# Patient Record
Sex: Female | Born: 1995
Health system: Southern US, Community
[De-identification: ages and names within clinical notes are randomized; demographics above are authoritative.]

## PROBLEM LIST (undated history)

## (undated) DIAGNOSIS — G43909 Migraine, unspecified, not intractable, without status migrainosus: Secondary | ICD-10-CM

## (undated) DIAGNOSIS — J189 Pneumonia, unspecified organism: Secondary | ICD-10-CM

## (undated) DIAGNOSIS — J45909 Unspecified asthma, uncomplicated: Secondary | ICD-10-CM

## (undated) DIAGNOSIS — R0600 Dyspnea, unspecified: Secondary | ICD-10-CM

## (undated) DIAGNOSIS — T4145XA Adverse effect of unspecified anesthetic, initial encounter: Secondary | ICD-10-CM

## (undated) DIAGNOSIS — J13 Pneumonia due to Streptococcus pneumoniae: Secondary | ICD-10-CM

## (undated) DIAGNOSIS — K900 Celiac disease: Secondary | ICD-10-CM

## (undated) DIAGNOSIS — T8859XA Other complications of anesthesia, initial encounter: Secondary | ICD-10-CM

## (undated) HISTORY — DX: Pneumonia due to Streptococcus pneumoniae: J13

## (undated) HISTORY — DX: Migraine, unspecified, not intractable, without status migrainosus: G43.909

## (undated) HISTORY — DX: Pneumonia, unspecified organism: J18.9

## (undated) HISTORY — PX: CHOLECYSTECTOMY: SHX55

## (undated) HISTORY — DX: Celiac disease: K90.0

---
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
## 2014-05-12 ENCOUNTER — Ambulatory Visit
Admission: RE | Admit: 2014-05-12 | Discharge: 2014-05-12 | Disposition: A | Discharge status: Home / Self Care | Payer: 59 | Source: Ambulatory Visit | Attending: Family Medicine | Admitting: Family Medicine

## 2014-05-12 ENCOUNTER — Other Ambulatory Visit: Payer: Self-pay | Admitting: Family Medicine

## 2014-05-12 DIAGNOSIS — M79672 Pain in left foot: Secondary | ICD-10-CM

## 2014-05-12 DIAGNOSIS — R609 Edema, unspecified: Secondary | ICD-10-CM

## 2017-01-15 ENCOUNTER — Encounter (HOSPITAL_COMMUNITY): Payer: Self-pay | Admitting: Emergency Medicine

## 2017-01-15 ENCOUNTER — Ambulatory Visit (HOSPITAL_COMMUNITY)
Admission: EM | Admit: 2017-01-15 | Discharge: 2017-01-15 | Disposition: A | Discharge status: Home / Self Care | Payer: 59 | Attending: Family Medicine | Admitting: Family Medicine

## 2017-01-15 DIAGNOSIS — R300 Dysuria: Secondary | ICD-10-CM

## 2017-01-15 DIAGNOSIS — Z9049 Acquired absence of other specified parts of digestive tract: Secondary | ICD-10-CM | POA: Diagnosis not present

## 2017-01-15 DIAGNOSIS — R102 Pelvic and perineal pain: Secondary | ICD-10-CM | POA: Diagnosis not present

## 2017-01-15 DIAGNOSIS — Z3202 Encounter for pregnancy test, result negative: Secondary | ICD-10-CM | POA: Diagnosis not present

## 2017-01-15 DIAGNOSIS — R109 Unspecified abdominal pain: Secondary | ICD-10-CM | POA: Diagnosis present

## 2017-01-15 DIAGNOSIS — N3000 Acute cystitis without hematuria: Secondary | ICD-10-CM

## 2017-01-15 DIAGNOSIS — R1024 Suprapubic pain: Secondary | ICD-10-CM

## 2017-01-15 DIAGNOSIS — Z880 Allergy status to penicillin: Secondary | ICD-10-CM | POA: Insufficient documentation

## 2017-01-15 LAB — POCT PREGNANCY, URINE: PREG TEST UR: NEGATIVE

## 2017-01-15 LAB — POCT URINALYSIS DIP (DEVICE)
BILIRUBIN URINE: NEGATIVE
Glucose, UA: NEGATIVE mg/dL
HGB URINE DIPSTICK: NEGATIVE
Ketones, ur: NEGATIVE mg/dL
NITRITE: POSITIVE — AB
PH: 7 (ref 5.0–8.0)
Protein, ur: NEGATIVE mg/dL
Specific Gravity, Urine: 1.01 (ref 1.005–1.030)
UROBILINOGEN UA: 0.2 mg/dL (ref 0.0–1.0)

## 2017-01-15 MED ORDER — NITROFURANTOIN MONOHYD MACRO 100 MG PO CAPS: 100.0000 mg | ORAL_CAPSULE | Freq: Two times a day (BID) | ORAL | 0 refills | Status: AC

## 2017-01-15 MED ORDER — PHENAZOPYRIDINE HCL 100 MG PO TABS: 100.0000 mg | ORAL_TABLET | Freq: Three times a day (TID) | ORAL | 0 refills | Status: AC | PRN

## 2017-01-15 MED ORDER — PHENAZOPYRIDINE HCL 100 MG PO TABS: 100.0000 mg | ORAL_TABLET | Freq: Three times a day (TID) | ORAL | 0 refills | Status: DC | PRN

## 2017-01-15 NOTE — ED Provider Notes (Signed)
[facility]    CSN: [id number] Arrival date & time: 01/15/17  1538     History   Chief Complaint Chief Complaint  Patient presents with  . Abdominal Cramping  . Dysuria    HPI Susan Elliott is a 21 y.o. female.   The history is provided by the patient. No language interpreter was used.  Abdominal Cramping  This is a new (C/O suprapubic cramping and dysuria) problem. Episode onset: few hours ago. The problem occurs constantly. The problem has been gradually worsening. Pertinent negatives include no chest pain, no abdominal pain, no headaches and no shortness of breath. Nothing aggravates the symptoms. Nothing relieves the symptoms. Treatments tried: She tried Azo at home without any improvement.  Dysuria  Pain quality:  Burning Pain severity:  Moderate Onset quality:  Gradual Duration: Few hours ago. Timing:  Constant Progression:  Worsening Chronicity:  New (She had kidney infection in the past and this was how it started) Recent urinary tract infections: no   Relieved by:  Nothing Worsened by:  Nothing Urinary symptoms: discolored urine, foul-smelling urine and frequent urination   Urinary symptoms: no hematuria   Urinary symptoms comment:  Denies vaginal discharge Associated symptoms: no abdominal pain   Risk factors: hx of pyelonephritis and sexually active   Risk factors: no sexually transmitted infections     History reviewed. No pertinent past medical history.  There are no active problems to display for this patient.   Past Surgical History:  Procedure Laterality Date  . CHOLECYSTECTOMY      OB History    No data available       Home Medications    Prior to Admission medications   Not on File    Family History History reviewed. No pertinent family history.  Social History Social History  Substance Use Topics  . Smoking status: Never Smoker  . Smokeless tobacco: Never Used  . Alcohol use Yes     Allergies    Penicillins   Review of Systems Review of Systems  Respiratory: Negative for shortness of breath.   Cardiovascular: Negative for chest pain.  Gastrointestinal: Negative for abdominal pain.  Genitourinary: Positive for dysuria.  Neurological: Negative for headaches.     Physical Exam Triage Vital Signs ED Triage Vitals  Enc Vitals Group     BP 01/15/17 1554 135/68     Pulse Rate 01/15/17 1554 89     Resp 01/15/17 1554 18     Temp 01/15/17 1554 98.3 F (36.8 C)     Temp Source 01/15/17 1554 Oral     SpO2 01/15/17 1554 99 %     Weight --      Height --      Head Circumference --      Peak Flow --      Pain Score 01/15/17 1552 6     Pain Loc --      Pain Edu? --      Excl. in [place]? --    No data found.   Updated Vital Signs BP 135/68 (BP Location: Right Arm)   Pulse 89   Temp 98.3 F (36.8 C) (Oral)   Resp 18   LMP 01/05/2017 (Exact Date)   SpO2 99%   Visual Acuity Right Eye Distance:   Left Eye Distance:   Bilateral Distance:    Right Eye Near:   Left Eye Near:    Bilateral Near:     Physical Exam  Constitutional: She appears well-developed.  Cardiovascular: Normal rate, regular rhythm and normal heart sounds.   No murmur heard. Pulmonary/Chest: Effort normal and breath sounds normal. No respiratory distress. She has no wheezes.  Abdominal: Soft. Bowel sounds are normal. She exhibits no distension and no mass. There is no tenderness. There is no rebound and no guarding.  Nursing note and vitals reviewed.    UC Treatments / Results  Labs (all labs ordered are listed, but only abnormal results are displayed) Labs Reviewed  POCT URINALYSIS DIP (DEVICE) - Abnormal; Notable for the following:       Result Value   Nitrite POSITIVE (*)    Leukocytes, UA SMALL (*)    All other components within normal limits  URINE CULTURE  POCT PREGNANCY, URINE    EKG  EKG Interpretation None       Radiology No results found.  Procedures Procedures  (including critical care time)  Medications Ordered in UC Medications - No data to display   Initial Impression / Assessment and Plan / UC Course  I have reviewed the triage vital signs and the nursing notes.  Pertinent labs & imaging results that were available during my care of the patient were reviewed by me and considered in my medical decision making (see chart for details).  Clinical Course as of Jan 15 1702  Nancy Fetter Jan 15, 2017  1702 Suprapubic pain and dysuria UA suspicious for UTI. Urine culture ordered. Urine pregnancy test neg. Will treat as non-complicated UTI with Nitrofurantoin while awaiting urine culture report. Pyridium prescribed prn dysuria. F/U as needed.   [KE]    Clinical Course User Index [KE] Kinnie Feil, MD    Urinalysis    Component Value Date/Time   LABSPEC 1.010 01/15/2017 1631   PHURINE 7.0 01/15/2017 1631   GLUCOSEU NEGATIVE 01/15/2017 1631   HGBUR NEGATIVE 01/15/2017 1631   BILIRUBINUR NEGATIVE 01/15/2017 1631   KETONESUR NEGATIVE 01/15/2017 1631   PROTEINUR NEGATIVE 01/15/2017 1631   UROBILINOGEN 0.2 01/15/2017 1631   NITRITE POSITIVE (A) 01/15/2017 1631   LEUKOCYTESUR SMALL (A) 01/15/2017 1631      Final Clinical Impressions(s) / UC Diagnoses   Final diagnoses:  None    New Prescriptions New Prescriptions   No medications on file     Kinnie Feil, MD 01/15/17 [phone]

## 2017-01-15 NOTE — Discharge Instructions (Signed)
°  UA suspicious for UTI. Urine culture ordered. Urine pregnancy test neg. Will treat as non-complicated UTI with Nitrofurantoin while awaiting urine culture report. Pyridium prescribed prn dysuria. F/U as needed. I have e-scribed your medication to the pharmacy of your choice.

## 2017-01-15 NOTE — ED Triage Notes (Signed)
The patient presented to the [facility] with a complaint of lower abdominal cramping, dysuria and urinary frequency that started today.

## 2017-01-18 LAB — URINE CULTURE: Culture: >=100000 — AB

## 2017-02-03 ENCOUNTER — Ambulatory Visit (INDEPENDENT_AMBULATORY_CARE_PROVIDER_SITE_OTHER): Payer: 59 | Admitting: Obstetrics & Gynecology

## 2017-02-03 ENCOUNTER — Encounter: Payer: Self-pay | Admitting: Obstetrics & Gynecology

## 2017-02-03 VITALS — BP 126/80 | Ht 67.25 in | Wt 198.0 lb

## 2017-02-03 DIAGNOSIS — L292 Pruritus vulvae: Secondary | ICD-10-CM | POA: Diagnosis not present

## 2017-02-03 LAB — WET PREP FOR TRICH, YEAST, CLUE
Clue Cells Wet Prep HPF POC: NONE SEEN
TRICH WET PREP: NONE SEEN

## 2017-02-03 MED ORDER — FLUCONAZOLE 150 MG PO TABS: 150.0000 mg | ORAL_TABLET | Freq: Every day | ORAL | 2 refills | Status: AC

## 2017-02-03 NOTE — Progress Notes (Signed)
    Susan Elliott 27-Feb-1996 [id number]        20 y.o.  G1P0010  Stable boyfriend.  On BCPs.    RP:  Established patient presenting because of vaginal/vulvar itching   HPI:  Had a Cystitis treated with Nitrofurantoin 01/15/2017.  Vaginal and vulvar itching started after the treatment.  Has frequent Yeast vaginitis and BV.  Using Lactobacilli vaginally once a week.  Not better when using condoms.  Well on BCPs otherwise.  Declines STI screen.  Past medical history,surgical history, problem list, medications, allergies, family history and social history were all reviewed and documented in the EPIC chart.  Directed ROS with pertinent positives and negatives documented in the history of present illness/assessment and plan.  Exam:  Vitals:   02/03/17 0955  BP: 126/80  Weight: 198 lb (89.8 kg)  Height: 5' 7.25" (1.708 m)   General appearance:  Normal  Gyn exam:  Vulva normal                     Speculum:  Vagina, cervix normal.  Mild increase in d/c, wet prep done.  Assessment/Plan:  21 y.o. G1P0010   1. Vulvar itching Confirmed Yeast Vaginitis/Vulvitis on Wet prep.  Fluconazole sent to pharmacy.  Recomnmendations discussed for treatment and prevention of recurrence.  Already using Lactobacilli vaginally.  F/U if decides to change from BCPs to Progestin IUD and for Annual/Gyn exam. - WET PREP FOR [place], YEAST, CLUE  Counseling on above issue >50% x 15 minutes.  Princess Bruins MD, 10:10 AM 02/03/2017

## 2017-02-03 NOTE — Patient Instructions (Signed)
1. Vulvar itching Confirmed Yeast Vaginitis/Vulvitis on Wet prep.  Fluconazole sent to pharmacy.  Recomnmendations discussed for treatment and prevention of recurrence.  Already using Lactobacilli vaginally.  F/U if decides to change from [facility] to Progestin IUD and for Annual/Gyn exam. - WET PREP FOR [place], YEAST, CLUE:  Yeasts present, otherwise normal.  Susan Elliott, it was a pleasure to see you today!  Enjoy your summer!

## 2017-03-07 ENCOUNTER — Telehealth: Payer: Self-pay | Admitting: *Deleted

## 2017-03-07 NOTE — Telephone Encounter (Signed)
Pt called c/o UTI asked if Rx could be sent to pharmacy, I explained to patient OV best for treatment for correct medication. Pt said said she unable to come in today, will try Urgent care later.

## 2017-03-08 ENCOUNTER — Ambulatory Visit (INDEPENDENT_AMBULATORY_CARE_PROVIDER_SITE_OTHER): Payer: 59 | Admitting: Obstetrics & Gynecology

## 2017-03-08 ENCOUNTER — Encounter: Payer: Self-pay | Admitting: Obstetrics & Gynecology

## 2017-03-08 VITALS — BP 126/82

## 2017-03-08 DIAGNOSIS — N3090 Cystitis, unspecified without hematuria: Secondary | ICD-10-CM | POA: Diagnosis not present

## 2017-03-08 DIAGNOSIS — R300 Dysuria: Secondary | ICD-10-CM

## 2017-03-08 LAB — URINALYSIS W MICROSCOPIC + REFLEX CULTURE
CRYSTALS: NONE SEEN [HPF]
Casts: NONE SEEN [LPF]
RBC / HPF: NONE SEEN RBC/HPF (ref <=2)
Yeast: NONE SEEN [HPF]

## 2017-03-08 MED ORDER — SULFAMETHOXAZOLE-TRIMETHOPRIM 800-160 MG PO TABS: 1.0000 | ORAL_TABLET | Freq: Two times a day (BID) | ORAL | 0 refills | Status: AC

## 2017-03-08 NOTE — Progress Notes (Signed)
    Susan Elliott 30-Jul-1996 [id number]        20 y.o.  G1P0010  RP:  Burning with miction  HPI:  Burning with miction with urinary frequency.  No back or flank pain.  No fever.  Normal vaginal secretion, no itching or odor.  Last Cystitis 01/15/2017 Rxed with MacroBID.  Past medical history,surgical history, problem list, medications, allergies, family history and social history were all reviewed and documented in the EPIC chart.  Directed ROS with pertinent positives and negatives documented in the history of present illness/assessment and plan.  Exam:  Vitals:   03/08/17 0838  BP: 126/82   General appearance:  Normal  CVAT neg bilaterally Mild suprapubic tenderness  Assessment/Plan:  21 y.o. G1P0010   1. Dysuria  Cystitis, no evidence of APN.  Nit pos, many bacteria.  Pending culture.  Bactrim prescription sent.  Will use Fluconazole PRN after ABTx.   - Urinalysis with Culture Reflex  2. Recurrent Cystitis Prevention discussed.  Declines prophylaxis at this time, will consider if more episodes.  Last Cystitis was 01/15/2017.   Counseling on above issues >50% x 15 minutes.  Princess Bruins MD, 8:56 AM 03/08/2017

## 2017-03-08 NOTE — Patient Instructions (Signed)
1. Dysuria  Cystitis, no evidence of APN.  Nit pos, many bacteria.  Pending culture.  Bactrim prescription sent.  Will use Fluconazole PRN after ABTx.   - Urinalysis with Culture Reflex  2. Recurrent Cystitis Prevention discussed.  Declines prophylaxis at this time, will consider if more episodes.  Last Cystitis was 01/15/2017.  Susan Elliott, good to see you today!  I will inform you of your U. Culture result as soon as available.   Urinary Tract Infection, Adult A urinary tract infection (UTI) is an infection of any part of the urinary tract, which includes the kidneys, ureters, bladder, and urethra. These organs make, store, and get rid of urine in the body. UTI can be a bladder infection (cystitis) or kidney infection (pyelonephritis). What are the causes? This infection may be caused by fungi, viruses, or bacteria. Bacteria are the most common cause of UTIs. This condition can also be caused by repeated incomplete emptying of the bladder during urination. What increases the risk? This condition is more likely to develop if:  You ignore your need to urinate or hold urine for long periods of time.  You do not empty your bladder completely during urination.  You wipe back to front after urinating or having a bowel movement, if you are female.  You are uncircumcised, if you are female.  You are constipated.  You have a urinary catheter that stays in place (indwelling).  You have a weak defense (immune) system.  You have a medical condition that affects your bowels, kidneys, or bladder.  You have diabetes.  You take antibiotic medicines frequently or for long periods of time, and the antibiotics no longer work well against certain types of infections (antibiotic resistance).  You take medicines that irritate your urinary tract.  You are exposed to chemicals that irritate your urinary tract.  You are female.  What are the signs or symptoms? Symptoms of this condition  include:  Fever.  Frequent urination or passing small amounts of urine frequently.  Needing to urinate urgently.  Pain or burning with urination.  Urine that smells bad or unusual.  Cloudy urine.  Pain in the lower abdomen or back.  Trouble urinating.  Blood in the urine.  Vomiting or being less hungry than normal.  Diarrhea or abdominal pain.  Vaginal discharge, if you are female.  How is this diagnosed? This condition is diagnosed with a medical history and physical exam. You will also need to provide a urine sample to test your urine. Other tests may be done, including:  Blood tests.  Sexually transmitted disease (STD) testing.  If you have had more than one UTI, a cystoscopy or imaging studies may be done to determine the cause of the infections. How is this treated? Treatment for this condition often includes a combination of two or more of the following:  Antibiotic medicine.  Other medicines to treat less common causes of UTI.  Over-the-counter medicines to treat pain.  Drinking enough water to stay hydrated.  Follow these instructions at home:  Take over-the-counter and prescription medicines only as told by your health care provider.  If you were prescribed an antibiotic, take it as told by your health care provider. Do not stop taking the antibiotic even if you start to feel better.  Avoid alcohol, caffeine, tea, and carbonated beverages. They can irritate your bladder.  Drink enough fluid to keep your urine clear or pale yellow.  Keep all follow-up visits as told by your health care provider. This  is important.  Make sure to: ? Empty your bladder often and completely. Do not hold urine for long periods of time. ? Empty your bladder before and after sex. ? Wipe from front to back after a bowel movement if you are female. Use each tissue one time when you wipe. Contact a health care provider if:  You have back pain.  You have a fever.  You  feel nauseous or vomit.  Your symptoms do not get better after 3 days.  Your symptoms go away and then return. Get help right away if:  You have severe back pain or lower abdominal pain.  You are vomiting and cannot keep down any medicines or water. This information is not intended to replace advice given to you by your health care provider. Make sure you discuss any questions you have with your health care provider. Document Released: 05/25/2005 Document Revised: 01/27/2016 Document Reviewed: 07/06/2015 Elsevier Interactive Patient Education  2017 [employer].

## 2017-03-09 ENCOUNTER — Ambulatory Visit: Payer: 59 | Admitting: Obstetrics & Gynecology

## 2017-03-09 LAB — URINE CULTURE

## 2017-03-12 ENCOUNTER — Encounter: Payer: Self-pay | Admitting: Obstetrics & Gynecology

## 2017-03-13 ENCOUNTER — Other Ambulatory Visit: Payer: Self-pay | Admitting: Women's Health

## 2017-03-13 ENCOUNTER — Telehealth: Payer: Self-pay | Admitting: *Deleted

## 2017-03-13 MED ORDER — METRONIDAZOLE 0.75 % VA GEL: VAGINAL | 0 refills | Status: DC

## 2017-03-13 NOTE — Telephone Encounter (Signed)
Patient said its impossible for her to get off work again.

## 2017-03-13 NOTE — Telephone Encounter (Signed)
(  Dr.Lavoie patient) this is patient my chart message:   I am having burning and itching. I already finished taking the sulfamethoxazole and also experienced itching so went ahead and got the prn fluconazole she gave me and that didn't help so it must be because a BV instead which I've experienced those way more than UTIs. I was prescribed Metronidazole vaginal cream a while ago when I used to get these a lot from [place] at [employer].. Could you guys prescribe me something for that? I've been in pain since Saturday evening. I thought the meds for the UTI that is why I didn't want to go to urgent care or anything.

## 2017-03-13 NOTE — Telephone Encounter (Signed)
Please call and review best to have OV to check if BV or Yeast.  Let me know if this is not possible

## 2017-03-13 NOTE — Telephone Encounter (Signed)
Telephone call, reviewed our policy is not to prescribe MetroGel over the phone states cannot come in. States Dr. Dellis Filbert has called in in the past. Reviewed we will call in this one time but we cannot do this on a regular basis. If no relief office visit is required. Agreeable with plan.

## 2017-03-13 NOTE — Telephone Encounter (Signed)
I did recommend OV for pt

## 2017-07-26 ENCOUNTER — Encounter: Payer: Self-pay | Admitting: Obstetrics & Gynecology

## 2017-07-26 ENCOUNTER — Ambulatory Visit (INDEPENDENT_AMBULATORY_CARE_PROVIDER_SITE_OTHER): Payer: 59 | Admitting: Obstetrics & Gynecology

## 2017-07-26 VITALS — BP 124/88 | Ht 67.0 in | Wt 203.0 lb

## 2017-07-26 DIAGNOSIS — Z113 Encounter for screening for infections with a predominantly sexual mode of transmission: Secondary | ICD-10-CM | POA: Diagnosis not present

## 2017-07-26 DIAGNOSIS — Z3041 Encounter for surveillance of contraceptive pills: Secondary | ICD-10-CM

## 2017-07-26 DIAGNOSIS — Z01419 Encounter for gynecological examination (general) (routine) without abnormal findings: Secondary | ICD-10-CM | POA: Diagnosis not present

## 2017-07-26 DIAGNOSIS — Z7189 Other specified counseling: Secondary | ICD-10-CM

## 2017-07-26 DIAGNOSIS — N898 Other specified noninflammatory disorders of vagina: Secondary | ICD-10-CM | POA: Diagnosis not present

## 2017-07-26 DIAGNOSIS — Z7185 Encounter for immunization safety counseling: Secondary | ICD-10-CM

## 2017-07-26 LAB — WET PREP FOR TRICH, YEAST, CLUE

## 2017-07-26 MED ORDER — NORETHINDRONE ACET-ETHINYL EST 1-20 MG-MCG PO TABS: 1.0000 | ORAL_TABLET | Freq: Every day | ORAL | 4 refills | Status: DC

## 2017-07-26 NOTE — Progress Notes (Signed)
Susan Elliott [birth year] [id number]   History:    21 y.o. G1P0A1 Stable boyfriend x 2 years  RP:  Established patient presenting for annual gyn exam   HPI:  Well on Junel 1/20.  Frequent BV infections.  No pelvic pain.  Breasts wnl now, but had more tenderness lately.  Currently having loose stools/Wheezing, seen at Urgent Care last evening.  Past medical history,surgical history, family history and social history were all reviewed and documented in the EPIC chart.  Gynecologic History Patient's last menstrual period was 07/14/2017. Contraception: OCP (estrogen/progesterone) Last Pap:  Previous pap normal per patient Last mammogram: Never  Obstetric History OB History  Gravida Para Term Preterm AB Living  1       1 0  SAB TAB Ectopic Multiple Live Births    1          # Outcome Date GA Lbr Len/2nd Weight Sex Delivery Anes PTL Lv  1 TAB                ROS: A ROS was performed and pertinent positives and negatives are included in the history.  GENERAL: No fevers or chills. HEENT: No change in vision, no earache, sore throat or sinus congestion. NECK: No pain or stiffness. CARDIOVASCULAR: No chest pain or pressure. No palpitations. PULMONARY: No shortness of breath, cough or wheeze. GASTROINTESTINAL: No abdominal pain, nausea, vomiting or diarrhea, melena or bright red blood per rectum. GENITOURINARY: No urinary frequency, urgency, hesitancy or dysuria. MUSCULOSKELETAL: No joint or muscle pain, no back pain, no recent trauma. DERMATOLOGIC: No rash, no itching, no lesions. ENDOCRINE: No polyuria, polydipsia, no heat or cold intolerance. No recent change in weight. HEMATOLOGICAL: No anemia or easy bruising or bleeding. NEUROLOGIC: No headache, seizures, numbness, tingling or weakness. PSYCHIATRIC: No depression, no loss of interest in normal activity or change in sleep pattern.     Exam:   BP 124/88   Ht 5\' 7"  (1.702 m)   Wt 203 lb (92.1 kg)   LMP 07/14/2017 Comment: PILL   BMI 31.79 kg/m   Body mass index is 31.79 kg/m.  General appearance : Well developed well nourished female. No acute distress HEENT: Eyes: no retinal hemorrhage or exudates,  Neck supple, trachea midline, no carotid bruits, no thyroidmegaly Lungs: Clear to auscultation, no rhonchi or wheezes, or rib retractions  Heart: Regular rate and rhythm, no murmurs or gallops Breast:Examined in sitting and supine position were symmetrical in appearance, no palpable masses or tenderness,  no skin retraction, no nipple inversion, no nipple discharge, no skin discoloration, no axillary or supraclavicular lymphadenopathy Abdomen: no palpable masses or tenderness, no rebound or guarding Extremities: no edema or skin discoloration or tenderness  Pelvic: Vulva normal  [place], Urethra, Skene Glands: Within normal limits             Vagina: No gross lesions or discharge  Cervix: No gross lesions or discharge.  Pap reflex/Gono-Chlam done.  Uterus  AV, normal size, shape and consistency, non-tender and mobile  Adnexa  Without masses or tenderness  Anus and perineum  normal    Assessment/Plan:  21 y.o. female for annual exam   1. Encounter for routine gynecological examination with Papanicolaou smear of cervix Normal gyn exam.  Pap reflex done.  Breasts wnl.  2. Encounter for surveillance of contraceptive pills Well on Junel 1/20.  No contraindication.  Junel 1/20 represcribed.  3. Vaginal discharge Yeast vaginitis.  Will use Fluconazole 1 tab per mouth x 1 (  Patient still has medication at home). - WET PREP FOR [place], YEAST, CLUE  4. Screen for STD (sexually transmitted disease) Condom use strongly recommended.  -Gono-Chlam done  5. HPV vaccine counseling Info and pamphlet given.  Patient declines first dose today.  Will call back when ready.  Counseling on above issues more than 50% for 10 minutes.  Princess Bruins MD, 12:02 PM 07/26/2017

## 2017-07-29 LAB — PAP IG, CT-NG, RFX HPV ASCU
C. trachomatis RNA, TMA: NOT DETECTED
N. gonorrhoeae RNA, TMA: NOT DETECTED

## 2017-07-29 NOTE — Patient Instructions (Signed)
1. Encounter for routine gynecological examination with Papanicolaou smear of cervix Normal gyn exam.  Pap reflex done.  Breasts wnl.  2. Encounter for surveillance of contraceptive pills Well on Junel 1/20.  No contraindication.  Junel 1/20 represcribed.  3. Vaginal discharge Yeast vaginitis.  Will use Fluconazole 1 tab per mouth x 1 (Patient still has medication at home). - WET PREP FOR [place], YEAST, CLUE  4. Screen for STD (sexually transmitted disease) Condom use strongly recommended.  -Gono-Chlam done  5. HPV vaccine counseling Info and pamphlet given.  Patient declines first dose today.  Will call back when ready.  Susan Elliott, it was a pleasure seeing you today!  I will inform you of your results as soon as available.  Health Maintenance, Female Adopting a healthy lifestyle and getting preventive care can go a long way to promote health and wellness. Talk with your health care provider about what schedule of regular examinations is right for you. This is a good chance for you to check in with your provider about disease prevention and staying healthy. In between checkups, there are plenty of things you can do on your own. Experts have done a lot of research about which lifestyle changes and preventive measures are most likely to keep you healthy. Ask your health care provider for more information. Weight and diet Eat a healthy diet  Be sure to include plenty of vegetables, fruits, low-fat dairy products, and lean protein.  Do not eat a lot of foods high in solid fats, added sugars, or salt.  Get regular exercise. This is one of the most important things you can do for your health. ? Most adults should exercise for at least 150 minutes each week. The exercise should increase your heart rate and make you sweat (moderate-intensity exercise). ? Most adults should also do strengthening exercises at least twice a week. This is in addition to the moderate-intensity exercise.  Maintain a  healthy weight  Body mass index (BMI) is a measurement that can be used to identify possible weight problems. It estimates body fat based on height and weight. Your health care provider can help determine your BMI and help you achieve or maintain a healthy weight.  For females 23 years of age and older: ? A BMI below 18.5 is considered underweight. ? A BMI of 18.5 to 24.9 is normal. ? A BMI of 25 to 29.9 is considered overweight. ? A BMI of 30 and above is considered obese.  Watch levels of cholesterol and blood lipids  You should start having your blood tested for lipids and cholesterol at 23 years of age, then have this test every 5 years.  You may need to have your cholesterol levels checked more often if: ? Your lipid or cholesterol levels are high. ? You are older than 23 years of age. ? You are at high risk for heart disease.  Cancer screening Lung Cancer  Lung cancer screening is recommended for adults 69-31 years old who are at high risk for lung cancer because of a history of smoking.  A yearly low-dose CT scan of the lungs is recommended for people who: ? Currently smoke. ? Have quit within the past 15 years. ? Have at least a 30-pack-year history of smoking. A pack year is smoking an average of one pack of cigarettes a day for 1 year.  Yearly screening should continue until it has been 15 years since you quit.  Yearly screening should stop if you develop a health  problem that would prevent you from having lung cancer treatment.  Breast Cancer  Practice breast self-awareness. This means understanding how your breasts normally appear and feel.  It also means doing regular breast self-exams. Let your health care provider know about any changes, no matter how small.  If you are in your 20s or 30s, you should have a clinical breast exam (CBE) by a health care provider every 1-3 years as part of a regular health exam.  If you are 64 or older, have a CBE every year. Also  consider having a breast X-ray (mammogram) every year.  If you have a family history of breast cancer, talk to your health care provider about genetic screening.  If you are at high risk for breast cancer, talk to your health care provider about having an MRI and a mammogram every year.  Breast cancer gene (BRCA) assessment is recommended for women who have family members with BRCA-related cancers. BRCA-related cancers include: ? Breast. ? Ovarian. ? Tubal. ? Peritoneal cancers.  Results of the assessment will determine the need for genetic counseling and BRCA1 and BRCA2 testing.  Cervical Cancer Your health care provider may recommend that you be screened regularly for cancer of the pelvic organs (ovaries, uterus, and vagina). This screening involves a pelvic examination, including checking for microscopic changes to the surface of your cervix (Pap test). You may be encouraged to have this screening done every 3 years, beginning at age 30.  For women ages 34-65, health care providers may recommend pelvic exams and Pap testing every 3 years, or they may recommend the Pap and pelvic exam, combined with testing for human papilloma virus (HPV), every 5 years. Some types of HPV increase your risk of cervical cancer. Testing for HPV may also be done on women of any age with unclear Pap test results.  Other health care providers may not recommend any screening for nonpregnant women who are considered low risk for pelvic cancer and who do not have symptoms. Ask your health care provider if a screening pelvic exam is right for you.  If you have had past treatment for cervical cancer or a condition that could lead to cancer, you need Pap tests and screening for cancer for at least 20 years after your treatment. If Pap tests have been discontinued, your risk factors (such as having a new sexual partner) need to be reassessed to determine if screening should resume. Some women have medical problems that  increase the chance of getting cervical cancer. In these cases, your health care provider may recommend more frequent screening and Pap tests.  Colorectal Cancer  This type of cancer can be detected and often prevented.  Routine colorectal cancer screening usually begins at 23 years of age and continues through 23 years of age.  Your health care provider may recommend screening at an earlier age if you have risk factors for colon cancer.  Your health care provider may also recommend using home test kits to check for hidden blood in the stool.  A small camera at the end of a tube can be used to examine your colon directly (sigmoidoscopy or colonoscopy). This is done to check for the earliest forms of colorectal cancer.  Routine screening usually begins at age 38.  Direct examination of the colon should be repeated every 5-10 years through 23 years of age. However, you may need to be screened more often if early forms of precancerous polyps or small growths are found.  Skin Cancer  Check your skin from head to toe regularly.  Tell your health care provider about any new moles or changes in moles, especially if there is a change in a mole's shape or color.  Also tell your health care provider if you have a mole that is larger than the size of a pencil eraser.  Always use sunscreen. Apply sunscreen liberally and repeatedly throughout the day.  Protect yourself by wearing long sleeves, pants, a wide-brimmed hat, and sunglasses whenever you are outside.  Heart disease, diabetes, and high blood pressure  High blood pressure causes heart disease and increases the risk of stroke. High blood pressure is more likely to develop in: ? People who have blood pressure in the high end of the normal range (130-139/85-89 mm Hg). ? People who are overweight or obese. ? People who are African American.  If you are 45-71 years of age, have your blood pressure checked every 3-5 years. If you are 81  years of age or older, have your blood pressure checked every year. You should have your blood pressure measured twice-once when you are at a hospital or clinic, and once when you are not at a hospital or clinic. Record the average of the two measurements. To check your blood pressure when you are not at a hospital or clinic, you can use: ? An automated blood pressure machine at a pharmacy. ? A home blood pressure monitor.  If you are between 19 years and 71 years old, ask your health care provider if you should take aspirin to prevent strokes.  Have regular diabetes screenings. This involves taking a blood sample to check your fasting blood sugar level. ? If you are at a normal weight and have a low risk for diabetes, have this test once every three years after 23 years of age. ? If you are overweight and have a high risk for diabetes, consider being tested at a younger age or more often. Preventing infection Hepatitis B  If you have a higher risk for hepatitis B, you should be screened for this virus. You are considered at high risk for hepatitis B if: ? You were born in a country where hepatitis B is common. Ask your health care provider which countries are considered high risk. ? Your parents were born in a high-risk country, and you have not been immunized against hepatitis B (hepatitis B vaccine). ? You have HIV or AIDS. ? You use needles to inject street drugs. ? You live with someone who has hepatitis B. ? You have had sex with someone who has hepatitis B. ? You get hemodialysis treatment. ? You take certain medicines for conditions, including cancer, organ transplantation, and autoimmune conditions.  Hepatitis C  Blood testing is recommended for: ? Everyone born from [birth year] through 1967. ? Anyone with known risk factors for hepatitis C.  Sexually transmitted infections (STIs)  You should be screened for sexually transmitted infections (STIs) including gonorrhea and chlamydia  if: ? You are sexually active and are younger than 23 years of age. ? You are older than 23 years of age and your health care provider tells you that you are at risk for this type of infection. ? Your sexual activity has changed since you were last screened and you are at an increased risk for chlamydia or gonorrhea. Ask your health care provider if you are at risk.  If you do not have HIV, but are at risk, it may be recommended that you take a prescription  medicine daily to prevent HIV infection. This is called pre-exposure prophylaxis (PrEP). You are considered at risk if: ? You are sexually active and do not regularly use condoms or know the HIV status of your partner(s). ? You take drugs by injection. ? You are sexually active with a partner who has HIV.  Talk with your health care provider about whether you are at high risk of being infected with HIV. If you choose to begin PrEP, you should first be tested for HIV. You should then be tested every 3 months for as long as you are taking PrEP. Pregnancy  If you are premenopausal and you may become pregnant, ask your health care provider about preconception counseling.  If you may become pregnant, take 400 to 800 micrograms (mcg) of folic acid every day.  If you want to prevent pregnancy, talk to your health care provider about birth control (contraception). Osteoporosis and menopause  Osteoporosis is a disease in which the bones lose minerals and strength with aging. This can result in serious bone fractures. Your risk for osteoporosis can be identified using a bone density scan.  If you are 29 years of age or older, or if you are at risk for osteoporosis and fractures, ask your health care provider if you should be screened.  Ask your health care provider whether you should take a calcium or vitamin D supplement to lower your risk for osteoporosis.  Menopause may have certain physical symptoms and risks.  Hormone replacement therapy  may reduce some of these symptoms and risks. Talk to your health care provider about whether hormone replacement therapy is right for you. Follow these instructions at home:  Schedule regular health, dental, and eye exams.  Stay current with your immunizations.  Do not use any tobacco products including cigarettes, chewing tobacco, or electronic cigarettes.  If you are pregnant, do not drink alcohol.  If you are breastfeeding, limit how much and how often you drink alcohol.  Limit alcohol intake to no more than 1 drink per day for nonpregnant women. One drink equals 12 ounces of beer, 5 ounces of wine, or 1 ounces of hard liquor.  Do not use street drugs.  Do not share needles.  Ask your health care provider for help if you need support or information about quitting drugs.  Tell your health care provider if you often feel depressed.  Tell your health care provider if you have ever been abused or do not feel safe at home. This information is not intended to replace advice given to you by your health care provider. Make sure you discuss any questions you have with your health care provider. Document Released: 02/28/2011 Document Revised: 01/21/2016 Document Reviewed: 05/19/2015 Elsevier Interactive Patient Education  [employer].

## 2017-08-07 ENCOUNTER — Encounter: Payer: Self-pay | Admitting: Obstetrics & Gynecology

## 2017-08-08 MED ORDER — NORETHIN ACE-ETH ESTRAD-FE 1-20 MG-MCG(24) PO CHEW: 1.0000 | CHEWABLE_TABLET | Freq: Every day | ORAL | 3 refills | Status: DC

## 2018-03-27 ENCOUNTER — Other Ambulatory Visit: Payer: Self-pay

## 2018-03-27 ENCOUNTER — Encounter (HOSPITAL_BASED_OUTPATIENT_CLINIC_OR_DEPARTMENT_OTHER): Payer: Self-pay | Admitting: Emergency Medicine

## 2018-03-27 ENCOUNTER — Emergency Department (HOSPITAL_BASED_OUTPATIENT_CLINIC_OR_DEPARTMENT_OTHER): Payer: 59

## 2018-03-27 ENCOUNTER — Emergency Department (HOSPITAL_BASED_OUTPATIENT_CLINIC_OR_DEPARTMENT_OTHER)
Admission: EM | Admit: 2018-03-27 | Discharge: 2018-03-27 | Disposition: A | Discharge status: Home / Self Care | Payer: 59 | Attending: Emergency Medicine | Admitting: Emergency Medicine

## 2018-03-27 DIAGNOSIS — J189 Pneumonia, unspecified organism: Secondary | ICD-10-CM | POA: Diagnosis not present

## 2018-03-27 DIAGNOSIS — G43009 Migraine without aura, not intractable, without status migrainosus: Secondary | ICD-10-CM

## 2018-03-27 DIAGNOSIS — G43909 Migraine, unspecified, not intractable, without status migrainosus: Secondary | ICD-10-CM | POA: Diagnosis present

## 2018-03-27 LAB — PREGNANCY, URINE: Preg Test, Ur: NEGATIVE

## 2018-03-27 MED ORDER — IBUPROFEN 800 MG PO TABS: 800.0000 mg | ORAL_TABLET | Freq: Three times a day (TID) | ORAL | 0 refills | Status: DC | PRN

## 2018-03-27 MED ORDER — METOCLOPRAMIDE HCL 5 MG/ML IJ SOLN
10.0000 mg | Freq: Once | INTRAMUSCULAR | Status: AC
  Administered 2018-03-27: 10 mg via INTRAVENOUS
  Filled 2018-03-27: qty 2

## 2018-03-27 MED ORDER — DEXAMETHASONE SODIUM PHOSPHATE 10 MG/ML IJ SOLN
10.0000 mg | Freq: Once | INTRAMUSCULAR | Status: AC
  Administered 2018-03-27: 10 mg via INTRAVENOUS
  Filled 2018-03-27: qty 1

## 2018-03-27 MED ORDER — DIPHENHYDRAMINE HCL 50 MG/ML IJ SOLN
INTRAMUSCULAR | Status: AC
  Filled 2018-03-27: qty 1

## 2018-03-27 MED ORDER — FLUCONAZOLE 200 MG PO TABS: 200.0000 mg | ORAL_TABLET | Freq: Once | ORAL | 1 refills | Status: DC | PRN

## 2018-03-27 MED ORDER — SODIUM CHLORIDE 0.9 % IV BOLUS
500.0000 mL | Freq: Once | INTRAVENOUS | Status: AC
  Administered 2018-03-27: 500 mL via INTRAVENOUS

## 2018-03-27 MED ORDER — KETOROLAC TROMETHAMINE 30 MG/ML IJ SOLN
30.0000 mg | Freq: Once | INTRAMUSCULAR | Status: AC
  Administered 2018-03-27: 30 mg via INTRAVENOUS
  Filled 2018-03-27: qty 1

## 2018-03-27 MED ORDER — DIPHENHYDRAMINE HCL 50 MG/ML IJ SOLN
25.0000 mg | Freq: Once | INTRAMUSCULAR | Status: AC
  Administered 2018-03-27: 25 mg via INTRAVENOUS
  Filled 2018-03-27: qty 1

## 2018-03-27 MED ORDER — DOXYCYCLINE HYCLATE 100 MG PO CAPS: 100.0000 mg | ORAL_CAPSULE | Freq: Two times a day (BID) | ORAL | 0 refills | Status: AC

## 2018-03-27 MED FILL — IBUPROFEN 800 MG TAB: 800 | 7 days supply | Qty: 21 | Fill #0

## 2018-03-27 MED FILL — DOXYCYCLINE HYCLATE 100 MG: 100 | 7 days supply | Qty: 14 | Fill #0

## 2018-03-27 MED FILL — FLUCONAZOLE 200 MG TABLET: 200 | 1 days supply | Qty: 1 | Fill #0 | Status: TO

## 2018-03-27 NOTE — Discharge Instructions (Signed)
You have been seen in the Emergency Department (ED) for a headache.  Please use Tylenol or Motrin as needed for symptoms, but only as written on the box.  As we have discussed, please follow up with your primary care doctor as soon as possible regarding today?s Emergency Department (ED) visit and your headache symptoms.    You do have a possible developing pneumonia on chest x-ray. I am prescribing an antibiotic to take for the next week along with medication in case you develop a yeast infection. This antibiotic can cause a rash if you are out in the sun for a [place] period of time.   Call your doctor or return to the ED if you have a worsening headache, sudden and severe headache, confusion, slurred speech, facial droop, weakness or numbness in any arm or leg, extreme fatigue, vision problems, or other symptoms that concern you.

## 2018-03-27 NOTE — ED Notes (Signed)
Pt on monitor 

## 2018-03-27 NOTE — ED Provider Notes (Signed)
Emergency Department Provider Note   I have reviewed the triage vital signs and the nursing notes.   HISTORY  Chief Complaint Migraine   HPI Susan Elliott is a 22 y.o. female with PMH of migraine HA [place] to the emergency department for evaluation of migraine type headache for the past 2 days without relief.  Patient has tried taking imitrex at home with no relief.  She was seen yesterday by her primary care physician who prescribed Toradol and cyclobenzaprine with no improvement in symptoms.  Patient does have some associated cough which is worse last night.  She tried Sudafed and Mucinex with no relief.  She states her headache is somewhat typical for migraine but atypical and that she has pain primarily on the temples both sides and some posterior.  No vision changes.  No sensitivity to light but she does have sensitivity to sound.  Denies any neck discomfort or stiffness.   Patient also notes a severe cough that started last night and seems to have gotten worse this AM.    History reviewed. No pertinent past medical history.  There are no active problems to display for this patient.   Past Surgical History:  Procedure Laterality Date  . CHOLECYSTECTOMY      Allergies Penicillins  Family History  Problem Relation Age of Onset  . Breast cancer Paternal Aunt   . Cancer Maternal Grandfather        lung    Social History Social History   Tobacco Use  . Smoking status: Never Smoker  . Smokeless tobacco: Never Used  Substance Use Topics  . Alcohol use: Yes  . Drug use: No    Review of Systems  Constitutional: No fever/chills Eyes: No visual changes. ENT: No sore throat. Positive phonophobia.  Cardiovascular: Denies chest pain. Respiratory: Denies shortness of breath. Positive cough.  Gastrointestinal: No abdominal pain.  No nausea, no vomiting.  No diarrhea.  No constipation. Genitourinary: Negative for dysuria. Musculoskeletal: Negative for back  pain. Skin: Negative for rash. Neurological: Negative for focal weakness or numbness. Positive HA.   10-point ROS otherwise negative.  ____________________________________________   PHYSICAL EXAM:  VITAL SIGNS: ED Triage Vitals  Enc Vitals Group     BP 03/27/18 0946 (!) 142/90     Pulse Rate 03/27/18 0946 (!) 146     Resp 03/27/18 0946 18     Temp 03/27/18 0946 98.2 F (36.8 C)     Temp src --      SpO2 03/27/18 0946 99 %     Weight 03/27/18 0945 218 lb (98.9 kg)     Height 03/27/18 0945 5' 7"  (1.702 m)     Pain Score 03/27/18 0945 7   Constitutional: Alert and oriented. Well appearing and in no acute distress. Eyes: Conjunctivae are normal. PERRL. EOMI. Head: Atraumatic. Nose: No congestion/rhinnorhea. Mouth/Throat: Mucous membranes are moist.  Oropharynx non-erythematous. Neck: No stridor.  No meningeal signs.   Cardiovascular: Normal rate, regular rhythm. Good peripheral circulation. Grossly normal heart sounds.   Respiratory: Normal respiratory effort.  No retractions. Lungs CTAB. Gastrointestinal: Soft and nontender. No distention.  Musculoskeletal: No lower extremity tenderness nor edema. No gross deformities of extremities. Neurologic:  Normal speech and language. No gross focal neurologic deficits are appreciated.  Skin:  Skin is warm, dry and intact. No rash noted.   ____________________________________________   LABS (all labs ordered are listed, but only abnormal results are displayed)  Labs Reviewed  PREGNANCY, URINE   ____________________________________________  RADIOLOGY  Dg Chest 2 View  Result Date: 03/27/2018 CLINICAL DATA:  Cough, chest congestion EXAM: CHEST - 2 VIEW COMPARISON:  05/09/2017 FINDINGS: Layering right pleural effusion with right lower lobe atelectasis or infiltrate. Heart is normal size. Left lung is clear. No acute bony abnormality. IMPRESSION: Small to moderate layering right effusion with right lower lobe atelectasis or  consolidation. Electronically Signed   By: Rolm Baptise M.D.   On: 03/27/2018 10:30    ____________________________________________   PROCEDURES  Procedure(s) performed:   Procedures  None ____________________________________________   INITIAL IMPRESSION / ASSESSMENT AND PLAN / ED COURSE  Pertinent labs & imaging results that were available during my care of the patient were reviewed by me and considered in my medical decision making (see chart for details).  Overall well-appearing female with past medical history of migraine headache.  She has an intact neurological exam.  No suspicion for infectious etiology.  No sudden onset, maximal intensity headache symptoms to suspect intracerebral hemorrhage or subarachnoid hemorrhage.  Plan for migraine cocktail and reassess.  CXR reviewed with possible consolidation. With cough symptoms worsening overnight will cover with abx for possible CAP. Patient HA resolved with migraine treatment in the ED. Advised close PCP and Neurology follow up.   At this time, I do not feel there is any life-threatening condition present. I have reviewed and discussed all results (EKG, imaging, lab, urine as appropriate), exam findings with patient. I have reviewed nursing notes and appropriate previous records.  I feel the patient is safe to be discharged home without further emergent workup. Discussed usual and customary return precautions. Patient and family (if present) verbalize understanding and are comfortable with this plan.  Patient will follow-up with their primary care provider. If they do not have a primary care provider, information for follow-up has been provided to them. All questions have been answered.  ____________________________________________  FINAL CLINICAL IMPRESSION(S) / ED DIAGNOSES  Final diagnoses:  Migraine without aura and without status migrainosus, not intractable  Community acquired pneumonia, unspecified laterality      MEDICATIONS GIVEN DURING THIS VISIT:  Medications  sodium chloride 0.9 % bolus 500 mL (0 mLs Intravenous Stopped 03/27/18 1116)  ketorolac (TORADOL) 30 MG/ML injection 30 mg (30 mg Intravenous Given 03/27/18 1033)  metoCLOPramide (REGLAN) injection 10 mg (10 mg Intravenous Given 03/27/18 1033)  diphenhydrAMINE (BENADRYL) injection 25 mg (25 mg Intravenous Given 03/27/18 1041)  dexamethasone (DECADRON) injection 10 mg (10 mg Intravenous Given 03/27/18 1033)     NEW OUTPATIENT MEDICATIONS STARTED DURING THIS VISIT:  Discharge Medication List as of 03/27/2018 11:18 AM    START taking these medications   Details  doxycycline (VIBRAMYCIN) 100 MG capsule Take 1 capsule (100 mg total) by mouth 2 (two) times daily for 14 days., Starting Tue 03/27/2018, Until Tue 04/10/2018, Print    fluconazole (DIFLUCAN) 200 MG tablet Take 1 tablet (200 mg total) by mouth once as needed for up to 1 dose (Yeast infection symptoms)., Starting Tue 03/27/2018, Print    ibuprofen (ADVIL,MOTRIN) 800 MG tablet Take 1 tablet (800 mg total) by mouth every 8 (eight) hours as needed., Starting Tue 03/27/2018, Print        Note:  This document was prepared using Dragon voice recognition software and may include unintentional dictation errors.  Nanda Quinton, MD Emergency Medicine    Long, [facility], MD 03/27/18 [phone]

## 2018-03-27 NOTE — ED Notes (Signed)
ED Provider at bedside. 

## 2018-03-27 NOTE — ED Triage Notes (Addendum)
Reports migraine x 2 days without relief after taking imitrex, sudafed and mucinex.  States she was seen yesterday and given shot of toradol at [facility] and an rx for muscle relaxers without relief.  Reports cough as well.

## 2018-03-28 ENCOUNTER — Encounter: Payer: Self-pay | Admitting: Neurology

## 2018-03-30 ENCOUNTER — Ambulatory Visit (INDEPENDENT_AMBULATORY_CARE_PROVIDER_SITE_OTHER): Payer: 59 | Admitting: Neurology

## 2018-03-30 ENCOUNTER — Encounter: Payer: Self-pay | Admitting: Neurology

## 2018-03-30 VITALS — BP 118/62 | HR 131 | Ht 67.0 in | Wt 223.0 lb

## 2018-03-30 DIAGNOSIS — G43829 Menstrual migraine, not intractable, without status migrainosus: Secondary | ICD-10-CM | POA: Diagnosis not present

## 2018-03-30 MED ORDER — NAPROXEN 500 MG PO TABS: ORAL_TABLET | ORAL | 3 refills | Status: DC

## 2018-03-30 NOTE — Patient Instructions (Signed)
Migraine Recommendations: 1.  To help prevent severe migraines during your menstrual period, start naproxen 500mg  twice daily for 14 days, beginning 7 days prior to first day of menstrual period. 2.  Take sumatriptan 100mg  at earliest onset of headache.  May repeat dose once in 2 hours if needed.  Do not exceed 200mg  in 24 hours. 3.  Limit use of pain relievers to no more than 2 days out of the week.  These medications include acetaminophen, ibuprofen, triptans and narcotics.  This will help reduce risk of rebound headaches. 4.  Be aware of common food triggers such as processed sweets, processed foods with nitrites (such as deli meat, hot dogs, sausages), foods with MSG, alcohol (such as wine), chocolate, certain cheeses, certain fruits (dried fruits, bananas, some citrus fruit), vinegar, diet soda. 4.  Avoid caffeine 5.  Routine exercise 6.  Proper sleep hygiene 7.  Stay adequately hydrated with water 8.  Keep a headache diary. 9.  Maintain proper stress management. 10.  Do not skip meals. 11.  Consider supplements:  Magnesium citrate 400mg  to 600mg  daily, riboflavin 400mg , Coenzyme Q 10 100mg  three times daily 12.  Follow up in 4 months.

## 2018-03-30 NOTE — Progress Notes (Signed)
NEUROLOGY CONSULTATION NOTE  Susan Elliott MRN: [id number] DOB: 12/03/1995  Referring provider: [name], PA-C Primary care provider: [name], PA-C  Reason for consult:  migraines  HISTORY OF PRESENT ILLNESS: Susan Elliott is a 22 year old right-handed female with Celiac disease and IBS who presents for migraines.  History supplemented by ED and referring provider's notes.  Onset:  22 years old Location:  Unilateral either side Quality:  throbbing Intensity:  Moderate (severe if during menstrual period).  She denies new headache, thunderclap headache or severe headache that wakes her from sleep. Aura:  no Prodrome:  no Postdrome:  no Associated symptoms:  Nausea, photophobia, phonophobia, rarely vomiting.  She denies associated osmophobia, autonomic symptoms, visual disturbance or unilateral numbness or weakness. Duration:  Until she falls asleep Frequency:  Once a week to once every other week (severe during her menstrual period for 3 days) Frequency of abortive medication: once a week to once every other week Triggers:  Menstrual period, sleep deprivation Exacerbating factors:  Menstrual period, sleep deprivation Relieving factors:  Sleep, peppermint oil, ice packs Activity:  Aggravates, some months cannot work for a day  She was seen in the ED on 03/27/18 for migraine and pneumonia.  This was a different migraine.  It was more severe, bilateral/back of neck and lasted 3 days.  It did not respond to sumatriptan 61m.  She was treated with a headache cocktail and prescribed doxycycline.  Current NSAIDS:  ibuprofen 8052mCurrent analgesics:  Excedrin Current triptans:  sumatriptan 5058murrent ergotamine:  no Current anti-emetic:  no Current muscle relaxants: no Current anti-anxiolytic:  no Current sleep aide:  no Current Antihypertensive medications:  no Current Antidepressant medications:  no Current Anticonvulsant medications:  no Current  anti-CGRP:  no Current Vitamins/Herbal/Supplements:  MVI Current Antihistamines/Decongestants:  Zyrtec, fluticasone Other therapy:  no Hormone/Birth Control:  Norethindrone-ethinyl estradiol  Past NSAIDS:  Aleve (variable efficacy) Past analgesics:  Tylenol Past abortive triptans:  no Past abortive ergotamine:  no Past muscle relaxants:  Cyclobenzaprine (ineffective) Past anti-emetic:  no Past antihypertensive medications:  no Past antidepressant medications:  no Past anticonvulsant medications:  no Past anti-CGRP:  no Past vitamins/Herbal/Supplements:  no Past antihistamines/decongestants:  no Other past therapies:  no  Caffeine:  1 to 2 diet Dr. PepMalachi Bondsily Alcohol:  rarely Smoker:  no Diet:  64 oz water daily Exercise:  Gym once a week, walks at work Depression:  no; Anxiety:  some Other pain:  no Sleep hygiene:  good Family history of headache:  Paternal aunt  10/19/17 LABS:  CBC with WBC 7.3, HGB 13.6, HCT 40.6, PLT 262; CMP with Na 139, K 4.2, glucose 92, BUN 8, Cr 0.75, t bili 0.5, ALP 57, AST 16, ALT 15.  PAST MEDICAL HISTORY: No past medical history on file.  PAST SURGICAL HISTORY: Past Surgical History:  Procedure Laterality Date  . CHOLECYSTECTOMY      MEDICATIONS: Current Outpatient Medications on File Prior to Visit  Medication Sig Dispense Refill  . cetirizine (ZYRTEC) 10 MG tablet Take 10 mg by mouth daily.    . dMarland Kitchenxycycline (VIBRAMYCIN) 100 MG capsule Take 1 capsule (100 mg total) by mouth 2 (two) times daily for 14 days. 20 capsule 0  . fluconazole (DIFLUCAN) 200 MG tablet Take 1 tablet (200 mg total) by mouth once as needed for up to 1 dose (Yeast infection symptoms). 1 tablet 1  . ibuprofen (ADVIL,MOTRIN) 800 MG tablet Take 1 tablet (800 mg total) by mouth every 8 (  eight) hours as needed. 21 tablet 0  . Multiple Vitamin (MULTIVITAMIN) tablet Take 1 tablet by mouth daily.    . Norethin Ace-Eth Estrad-FE 1-20 MG-MCG(24) CHEW Chew 1 tablet by mouth  daily. 84 tablet 3  . norethindrone-ethinyl estradiol (MICROGESTIN,JUNEL,LOESTRIN) 1-20 MG-MCG tablet Take 1 tablet by mouth daily. 3 Package 4   No current facility-administered medications on file prior to visit.     ALLERGIES: Allergies  Allergen Reactions  . Penicillins Rash    FAMILY HISTORY: Family History  Problem Relation Age of Onset  . Breast cancer Paternal Aunt   . Cancer Maternal Grandfather        lung    SOCIAL HISTORY: Social History   Socioeconomic History  . Marital status: Single    Spouse name: Not on file  . Number of children: Not on file  . Years of education: Not on file  . Highest education level: Some college, no degree  Occupational History  . Occupation: CNA    Employer: [employer]  . Financial resource strain: Not on file  . Food insecurity:    Worry: Not on file    Inability: Not on file  . Transportation needs:    Medical: Not on file    Non-medical: Not on file  Tobacco Use  . Smoking status: Never Smoker  . Smokeless tobacco: Never Used  Substance and Sexual Activity  . Alcohol use: Yes  . Drug use: No  . Sexual activity: Yes    Partners: Male    Birth control/protection: Pill    Comment: 1ST intercourse- 17, partners- 2, current partner- 2 yrs   Lifestyle  . Physical activity:    Days per week: Not on file    Minutes per session: Not on file  . Stress: Not on file  Relationships  . Social connections:    Talks on phone: Not on file    Gets together: Not on file    Attends religious service: Not on file    Active member of club or organization: Not on file    Attends meetings of clubs or organizations: Not on file    Relationship status: Not on file  . Intimate partner violence:    Fear of current or ex partner: Not on file    Emotionally abused: Not on file    Physically abused: Not on file    Forced sexual activity: Not on file  Other Topics Concern  . Not on file  Social History Narrative    Patient is right handed. She lives with her mother. She drinks one soda a day. She walks once a week, though states she usually makes 10,000 steps a day on her fit bit. 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    REVIEW OF SYSTEMS: Constitutional: No fevers, chills, or sweats, no generalized fatigue, change in appetite Eyes: No visual changes, double vision, eye pain Ear, nose and throat: No hearing loss, ear pain, nasal congestion, sore throat Cardiovascular: No chest pain, palpitations Respiratory:  No shortness of breath at rest or with exertion, wheezes GastrointestinaI: No nausea, vomiting, diarrhea, abdominal pain, fecal incontinence Genitourinary:  No dysuria, urinary retention or frequency Musculoskeletal:  No neck pain, back pain Integumentary: No rash, pruritus, skin lesions Neurological: as above Psychiatric: No depression, insomnia, anxiety Endocrine: No palpitations, fatigue, diaphoresis, mood swings, change in appetite, change in weight, increased thirst Hematologic/Lymphatic:  No purpura, petechiae. Allergic/Immunologic: no itchy/runny eyes, nasal congestion, recent allergic reactions, rashes  PHYSICAL EXAM: Vitals:   03/30/18 1346  BP: 118/62  Pulse: (!) 131  SpO2: 98%   General: No acute distress.  Patient appears well-groomed.  Head:  Normocephalic/atraumatic Eyes:  fundi examined but not visualized Neck: supple, no paraspinal tenderness, full range of motion Back: No paraspinal tenderness Heart: regular rate and rhythm Lungs: Clear to auscultation bilaterally. Vascular: No carotid bruits. Neurological Exam: Mental status: alert and oriented to person, place, and time, recent and remote memory intact, fund of knowledge intact, attention and concentration intact, speech fluent and not dysarthric, language intact. Cranial nerves: CN I: not tested CN II: pupils equal, round and reactive to light, visual fields intact CN III, IV, VI:  full range of motion,  no nystagmus, no ptosis CN V: facial sensation intact CN VII: upper and lower face symmetric CN VIII: hearing intact CN IX, X: gag intact, uvula midline CN XI: sternocleidomastoid and trapezius muscles intact CN XII: tongue midline Bulk & Tone: normal, no fasciculations. Motor:  5/5 throughout  Sensation: temperature and vibration sensation intact. Deep Tendon Reflexes:  2+ throughout, toes downgoing.  Finger to nose testing:  Without dysmetria.  Heel to shin:  Without dysmetria.  Gait:  Normal station and stride.  Able to turn and tandem walk. Romberg negative.  IMPRESSION: Menstrually related migraines  PLAN: 1.  Perimenstrual prophylaxis:  start naproxen 541m twice daily for 14 days, beginning 7 days prior to first day of menstrual period. 2.  Increase sumatriptan to 1065mat earliest onset of headache.  May repeat dose once in 2 hours if needed.  Do not exceed 20063mn 24 hours. 3.  Limit use of pain relievers to no more than 2 days out of the week.  These medications include acetaminophen, ibuprofen, triptans and narcotics.  This will help reduce risk of rebound headaches. 4.  Be aware of common food triggers such as processed sweets, processed foods with nitrites (such as deli meat, hot dogs, sausages), foods with MSG, alcohol (such as wine), chocolate, certain cheeses, certain fruits (dried fruits, bananas, some citrus fruit), vinegar, diet soda. 4.  Avoid caffeine 5.  Routine exercise 6.  Proper sleep hygiene 7.  Stay adequately hydrated with water 8.  Keep a headache diary. 9.  Maintain proper stress management. 10.  Do not skip meals. 11.  Consider supplements:  Magnesium citrate 400m79m 600mg5mly, riboflavin 400mg,60mnzyme Q 10 100mg t61m times daily 12.  Follow up in 4 months.  Thank you for allowing me to take part in the care of this patient.  Adam JaMetta ClinesC:  CourtneMarda Stalker

## 2018-07-11 ENCOUNTER — Other Ambulatory Visit: Payer: Self-pay | Admitting: Obstetrics & Gynecology

## 2018-08-02 ENCOUNTER — Encounter: Payer: Self-pay | Admitting: Obstetrics & Gynecology

## 2018-08-02 ENCOUNTER — Ambulatory Visit (INDEPENDENT_AMBULATORY_CARE_PROVIDER_SITE_OTHER): Payer: 59 | Admitting: Obstetrics & Gynecology

## 2018-08-02 VITALS — BP 126/84 | Ht 67.0 in | Wt 234.0 lb

## 2018-08-02 DIAGNOSIS — Z01419 Encounter for gynecological examination (general) (routine) without abnormal findings: Secondary | ICD-10-CM

## 2018-08-02 DIAGNOSIS — Z113 Encounter for screening for infections with a predominantly sexual mode of transmission: Secondary | ICD-10-CM

## 2018-08-02 DIAGNOSIS — Z3041 Encounter for surveillance of contraceptive pills: Secondary | ICD-10-CM | POA: Diagnosis not present

## 2018-08-02 DIAGNOSIS — E6609 Other obesity due to excess calories: Secondary | ICD-10-CM | POA: Diagnosis not present

## 2018-08-02 DIAGNOSIS — Z6836 Body mass index (BMI) 36.0-36.9, adult: Secondary | ICD-10-CM

## 2018-08-02 MED ORDER — NORETHIN ACE-ETH ESTRAD-FE 1-20 MG-MCG(24) PO CHEW: CHEWABLE_TABLET | ORAL | 4 refills | Status: DC

## 2018-08-02 NOTE — Progress Notes (Signed)
Susan Elliott 1996-04-01 [id number]   History:    22 y.o. G0 Single.  Starting Nursing School spring 2020  RP:  Established patient presenting for annual gyn exam   HPI: Well on birth control pill with the generic of Loestrin FE 1/20.  No breakthrough bleeding.  No pelvic pain.  Broke up with boyfriend.  Currently abstinent.  Urine and bowel movements normal.  Breasts normal.  Body mass index 36.65.  Not regularly physically active.  Past medical history,surgical history, family history and social history were all reviewed and documented in the EPIC chart.  Gynecologic History Patient's last menstrual period was 07/20/2018. Contraception: OCP (estrogen/progesterone) Last Pap: 06/2017. Results were: Negative Last mammogram: Never Bone Density: Never Colonoscopy: Never  Obstetric History OB History  Gravida Para Term Preterm AB Living  1       1 0  SAB TAB Ectopic Multiple Live Births    1          # Outcome Date GA Lbr Len/2nd Weight Sex Delivery Anes PTL Lv  1 TAB              ROS: A ROS was performed and pertinent positives and negatives are included in the history.  GENERAL: No fevers or chills. HEENT: No change in vision, no earache, sore throat or sinus congestion. NECK: No pain or stiffness. CARDIOVASCULAR: No chest pain or pressure. No palpitations. PULMONARY: No shortness of breath, cough or wheeze. GASTROINTESTINAL: No abdominal pain, nausea, vomiting or diarrhea, melena or bright red blood per rectum. GENITOURINARY: No urinary frequency, urgency, hesitancy or dysuria. MUSCULOSKELETAL: No joint or muscle pain, no back pain, no recent trauma. DERMATOLOGIC: No rash, no itching, no lesions. ENDOCRINE: No polyuria, polydipsia, no heat or cold intolerance. No recent change in weight. HEMATOLOGICAL: No anemia or easy bruising or bleeding. NEUROLOGIC: No headache, seizures, numbness, tingling or weakness. PSYCHIATRIC: No depression, no loss of interest in normal activity or  change in sleep pattern.     Exam:   BP 126/84   Ht 5\' 7"  (1.702 m)   Wt 234 lb (106.1 kg)   LMP 07/20/2018 Comment: PILL  BMI 36.65 kg/m   Body mass index is 36.65 kg/m.  General appearance : Well developed well nourished female. No acute distress HEENT: Eyes: no retinal hemorrhage or exudates,  Neck supple, trachea midline, no carotid bruits, no thyroidmegaly Lungs: Clear to auscultation, no rhonchi or wheezes, or rib retractions  Heart: Regular rate and rhythm, no murmurs or gallops Breast:Examined in sitting and supine position were symmetrical in appearance, no palpable masses or tenderness,  no skin retraction, no nipple inversion, no nipple discharge, no skin discoloration, no axillary or supraclavicular lymphadenopathy Abdomen: no palpable masses or tenderness, no rebound or guarding Extremities: no edema or skin discoloration or tenderness  Pelvic: Vulva: Normal             Vagina: No gross lesions or discharge  Cervix: No gross lesions or discharge.  Pap reflex done, Gono-Chlam.  Uterus  AV, normal size, shape and consistency, non-tender and mobile  Adnexa  Without masses or tenderness  Anus: Normal   Assessment/Plan:  22 y.o. female for annual exam   1. Encounter for routine gynecological examination with Papanicolaou smear of cervix Normal gynecologic exam.  Pap reflex done today.  Will f/u Gardasil.  Breast normal.  2. Encounter for surveillance of contraceptive pills Well on the generic of Loestrin Fe 1/20.  No contraindication to continue on birth control  pills.  Represcribed.  3. Screen for STD (sexually transmitted disease) Condoms strongly recommended - Gono-Chlam on Pap - HIV antibody (with reflex) - RPR - Hepatitis C Antibody - Hepatitis B Surface AntiGEN  4. Class 2 obesity due to excess calories without serious comorbidity with body mass index (BMI) of 36.0 to 36.9 in adult Lower calorie/carb diet such as [employer] recommended.  Aerobic  physical activity 5 times a week and weightlifting every 2 days  Other orders - Norethin Ace-Eth Estrad-FE 1-20 MG-MCG(24) CHEW; CHEW 1 TABLET DAILY  Princess Bruins MD, 11:34 AM 08/02/2018

## 2018-08-06 LAB — RPR: RPR Ser Ql: NONREACTIVE

## 2018-08-06 LAB — HEPATITIS C ANTIBODY
HEP C AB: NONREACTIVE
SIGNAL TO CUT-OFF: 0.01 (ref <1.00)

## 2018-08-06 LAB — HEPATITIS B SURFACE ANTIGEN: Hepatitis B Surface Ag: NONREACTIVE

## 2018-08-06 LAB — HIV ANTIBODY (ROUTINE TESTING W REFLEX): HIV: NONREACTIVE

## 2018-08-07 ENCOUNTER — Other Ambulatory Visit: Payer: Self-pay

## 2018-08-07 ENCOUNTER — Telehealth: Payer: Self-pay

## 2018-08-07 LAB — PAP IG, CT-NG, RFX HPV ASCU
Chlamydia, Nuc. Acid Amp: NEGATIVE
Gonococcus by Nucleic Acid Amp: NEGATIVE
PAP Smear Comment: 0

## 2018-08-07 MED ORDER — NAPROXEN 500 MG PO TABS: 500.0000 mg | ORAL_TABLET | ORAL | 4 refills | Status: DC

## 2018-08-07 NOTE — Telephone Encounter (Addendum)
Patient informed. She was not treated for yeast at visit. Rx?  Patient has been on antibiotics for pneumonia. She is seeing MD tomorrow and thinks there will be more antibiotics. Just fyi.

## 2018-08-07 NOTE — Telephone Encounter (Signed)
-----   Message from Princess Bruins, MD sent at 08/07/2018 10:27 AM EST ----- Pap negative.  Gono-Chlam negative.  Yeasts on Pap, please make sure she was treated.

## 2018-08-08 MED ORDER — FLUCONAZOLE 150 MG PO TABS: 150.0000 mg | ORAL_TABLET | Freq: Every day | ORAL | 3 refills | Status: DC

## 2018-08-08 NOTE — Telephone Encounter (Signed)
Please send Fluconazole 150 mg 1 tab daily x 3.  #3, refill x 3.  Treat now and can retreat after finishing ABTx or anytime she is Sxic.

## 2018-08-08 NOTE — Telephone Encounter (Signed)
Spoke with patient and advised. Rx sent.

## 2018-08-09 ENCOUNTER — Encounter: Payer: Self-pay | Admitting: Pulmonary Disease

## 2018-08-09 ENCOUNTER — Encounter: Payer: Self-pay | Admitting: Obstetrics & Gynecology

## 2018-08-09 ENCOUNTER — Ambulatory Visit (INDEPENDENT_AMBULATORY_CARE_PROVIDER_SITE_OTHER): Payer: 59 | Admitting: Pulmonary Disease

## 2018-08-09 VITALS — BP 118/64 | HR 91 | Ht 67.0 in | Wt 235.6 lb

## 2018-08-09 DIAGNOSIS — R05 Cough: Secondary | ICD-10-CM | POA: Insufficient documentation

## 2018-08-09 DIAGNOSIS — J13 Pneumonia due to Streptococcus pneumoniae: Secondary | ICD-10-CM | POA: Diagnosis not present

## 2018-08-09 DIAGNOSIS — R053 Chronic cough: Secondary | ICD-10-CM

## 2018-08-09 HISTORY — DX: Pneumonia due to Streptococcus pneumoniae: J13

## 2018-08-09 NOTE — Progress Notes (Signed)
Synopsis: Referred in 07/2018 for persistent pneumonia  Subjective:   PATIENT ID: Susan Elliott GENDER: female DOB: February 08, 1996, MRN: [id number]   HPI  Chief Complaint  Patient presents with  . Consult    had pneumonia in Aug 2019 but had a difficult time getting it to clear.  Treated by PCP with antibiotics x 3 and pred once.  Denies past problems with breathing etc.  Has hx celiac dis   [name] is a 22 year old female with celiac disease who presents to pulmonary clinic as a new consult for shortness of breath.  Records from [facility] college were reviewed and summarized as follows: Patient was seen in ER in July 2019 and diagnosed with pneumonia secondary to strep pneumonia.  For the last several months, she has persistent chest tightness and shortness of breath with exertion. Associated with nonproductive coughing. Denies wheezing. Symptoms worse with illness and activity. Improves with rest.  Has previously tried an inhaler in the past however not improved. She has had three rounds of antibiotics since July which temporarily improves her symptoms however they return after a few weeks he has some done. She has mild allergies. Denies sinus pain.   No personal or family hx of asthma.   Environmental exposures: None Works in nursing home   I have personally reviewed patient's past medical/family/social history, allergies, current medications.  Past Medical History:  Diagnosis Date  . Celiac disease 2011  . Migraine   . Pneumonia 2019 aug     Family History  Problem Relation Age of Onset  . Breast cancer Paternal Aunt   . Cancer Maternal Grandfather        lung     Social History   Occupational History  . Occupation: CNA    Employer: [employer]  Tobacco Use  . Smoking status: Never Smoker  . Smokeless tobacco: Never Used  Substance and Sexual Activity  . Alcohol use: Yes    Comment: OCC  . Drug use: No  . Sexual activity: Not Currently   Birth control/protection: Pill    Comment: 1ST intercourse- 17, partners- 2,    Allergies  Allergen Reactions  . Penicillins Rash     Outpatient Medications Prior to Visit  Medication Sig Dispense Refill  . cetirizine (ZYRTEC) 10 MG tablet Take 10 mg by mouth daily.    Marland Kitchen dextromethorphan-guaiFENesin (MUCINEX DM) 30-600 MG 12hr tablet Take 1 tablet by mouth 2 (two) times daily.    . Multiple Vitamin (MULTIVITAMIN) tablet Take 1 tablet by mouth daily.    . naproxen (NAPROSYN) 500 MG tablet Take 1 tab twice daily for 2 weeks, beginning one week prior to first day of menstrual period. 28 tablet 3  . Norethin Ace-Eth Estrad-FE 1-20 MG-MCG(24) CHEW CHEW 1 TABLET DAILY 84 tablet 4  . naproxen (NAPROSYN) 500 MG tablet Take 1 tablet (500 mg total) by mouth as directed. Take 1 tab twice daily for 2 weeks, beginning one week prior to first day of menstrual period. 84 tablet 4  . fluconazole (DIFLUCAN) 150 MG tablet Take 1 tablet (150 mg total) by mouth daily. (Patient not taking: Reported on 08/09/2018) 3 tablet 3   No facility-administered medications prior to visit.     Review of Systems  Constitutional: Negative for chills, fever, malaise/fatigue and weight loss.  HENT: Negative for congestion, sinus pain and sore throat.   Respiratory: Positive for cough and shortness of breath. Negative for hemoptysis, sputum production and wheezing.   Cardiovascular: Positive for  chest pain (Chest tightness) and PND. Negative for palpitations and leg swelling.  Gastrointestinal: Negative for abdominal pain, heartburn and nausea.  Genitourinary: Negative for frequency.  Musculoskeletal: Negative for myalgias.  Skin: Negative for rash.  Neurological: Positive for headaches. Negative for dizziness and weakness.  Endo/Heme/Allergies: Negative for environmental allergies.     Objective:  Physical Exam   Vitals:   08/09/18 0939  BP: 118/64  Pulse: 91  SpO2: 99%  Weight: 235 lb 9.6 oz (106.9 kg)    Height: 5' 7"  (1.702 m)   SpO2: 99 % O2 Device: None (Room air)  Physical Exam: General: Well-appearing, no acute distress HENT: [place], AT, OP clear, MMM Eyes: EOMI, no scleral icterus Respiratory: Clear to auscultation bilaterally.  No crackles, wheezing or rales Cardiovascular: RRR, -M/R/G, no JVD GI: BS+, soft, nontender Extremities:-Edema,-tenderness Neuro: AAO x4, CNII-XII grossly intact Skin: Intact, no rashes or bruising Psych: Normal mood, normal affect  Chest imaging: None on file  PFT: None on file  I have personally reviewed the above labs, images and tests noted above.    Assessment & Plan:   Discussion: 22 year old female with history of recent pneumonia status post antibiotics and steroids x2 associated with chronic nonproductive cough.  Cough has not resolved.  #Chronic cough Cough has been ongoing for > 8 weeks however has had interval pneumonia.  This may be postinfectious cough however with symptoms of chest tightness, I am concerned about asthma.  Minimal allergy and reflux symptoms. --We will obtain full pulmonary function test to evaluate for asthma  #Recent pneumonia No evidence of acute illness on exam --No indications for treatment at this time  Orders Placed This Encounter  Procedures  . Pulmonary function test    Standing Status:   Future    Standing Expiration Date:   08/10/2019    Scheduling Instructions:     Before next visit in one week    Order Specific Question:   Where should this test be performed?    Answer:   [facility]    Order Specific Question:   Full PFT: includes the following: basic spirometry, spirometry pre & post bronchodilator, diffusion capacity (DLCO), lung volumes    Answer:   Full PFT  No orders of the defined types were placed in this encounter.   Return in 1 week (on 08/16/2018).   Thank you for choosing [facility] for your health needs!   Nesanel Aguila Rodman Pickle, MD [facility] 08/09/2018  10:25 AM  Personal pager: [phone] If unanswered, please page CCM On-call: [phone]

## 2018-08-09 NOTE — Patient Instructions (Signed)
1. Encounter for routine gynecological examination with Papanicolaou smear of cervix Normal gynecologic exam.  Pap reflex done today.  Will f/u Gardasil.  Breast normal.  2. Encounter for surveillance of contraceptive pills Well on the generic of Loestrin Fe 1/20.  No contraindication to continue on birth control pills.  Represcribed.  3. Screen for STD (sexually transmitted disease) Condoms strongly recommended - Gono-Chlam on Pap - HIV antibody (with reflex) - RPR - Hepatitis C Antibody - Hepatitis B Surface AntiGEN  4. Class 2 obesity due to excess calories without serious comorbidity with body mass index (BMI) of 36.0 to 36.9 in adult Lower calorie/carb diet such as [employer] recommended.  Aerobic physical activity 5 times a week and weightlifting every 2 days  Other orders - Norethin Ace-Eth Estrad-FE 1-20 MG-MCG(24) CHEW; CHEW 1 TABLET DAILY  Tiffancy, it was a pleasure seeing you today!  I will inform you of your results as soon as they are available.

## 2018-08-09 NOTE — Patient Instructions (Addendum)
#  Chronic cough Cough has been ongoing for > 8 weeks however has had interval pneumonia.  This may be postinfectious cough however with symptoms of chest tightness, I am concerned about asthma.  Minimal allergy and reflux symptoms. --We will obtain full pulmonary function test to evaluate for asthma  #Recent pneumonia No evidence of acute illness on exam --No indications for treatment at this time    Cough, Adult A cough helps to clear your throat and lungs. A cough may last only 2-3 weeks (acute), or it may last longer than 8 weeks (chronic). Many different things can cause a cough. A cough may be a sign of an illness or another medical condition. Follow these instructions at home:  Pay attention to any changes in your cough.  Take medicines only as told by your doctor. ? If you were prescribed an antibiotic medicine, take it as told by your doctor. Do not stop taking it even if you start to feel better. ? Talk with your doctor before you try using a cough medicine.  Drink enough fluid to keep your pee (urine) clear or pale yellow.  If the air is dry, use a cold steam vaporizer or humidifier in your home.  Stay away from things that make you cough at work or at home.  If your cough is worse at night, try using extra pillows to raise your head up higher while you sleep.  Do not smoke, and try not to be around smoke. If you need help quitting, ask your doctor.  Do not have caffeine.  Do not drink alcohol.  Rest as needed. Contact a doctor if:  You have new problems (symptoms).  You cough up yellow fluid (pus).  Your cough does not get better after 2-3 weeks, or your cough gets worse.  Medicine does not help your cough and you are not sleeping well.  You have pain that gets worse or pain that is not helped with medicine.  You have a fever.  You are losing weight and you do not know why.  You have night sweats. Get help right away if:  You cough up blood.  You  have trouble breathing.  Your heartbeat is very fast. This information is not intended to replace advice given to you by your health care provider. Make sure you discuss any questions you have with your health care provider. Document Released: 04/28/2011 Document Revised: 01/21/2016 Document Reviewed: 10/22/2014 Elsevier Interactive Patient Education  [employer].

## 2018-08-14 NOTE — Progress Notes (Signed)
NEUROLOGY FOLLOW UP OFFICE NOTE  Susan Elliott [id number]  HISTORY OF PRESENT ILLNESS: Susan Elliott is a 22 year old right-handed female with celiac disease and IBS who follows up for migraines.  UPDATE: Intensity:  severe Duration:  1 day (sumatriptan sometimes works but sometimes headache returns later in the day) Frequency:  4 days a month.  Naproxen perimenstrual prophylaxis prevents the intractable migraine during her menstrual period. Frequency of abortive medication: 4 days a month Perimenstrual prophylaxis: Naproxen 500 mg twice daily for 14 days, beginning 7 days prior to first day of menstrual period. Rescue protocol:  Either sumatriptan, sometimes may take naproxen or Excedrin instead. Current NSAIDS: Naproxen Current analgesics: Excedrin Current triptans: Sumatriptan 100 mg (efficacy  Current ergotamine: None Current anti-emetic: None Current muscle relaxants: None Current anti-anxiolytic: None Current sleep aide: None Current Antihypertensive medications: None Current Antidepressant medications: None Current Anticonvulsant medications: None Current anti-CGRP: None Current Vitamins/Herbal/Supplements: Multivitamin Current Antihistamines/Decongestants: Zyrtec, fluticasone Other therapy: None Hormone/birth control: Norethindrone-ethynyl estradiol  Caffeine: 1-2 diet Dr. Samson Frederic daily Alcohol: Rarely Smoker: No Diet: 64 ounces of water daily Exercise: Gym once a week, walks at work Depression: No; Anxiety: Some Other pain: No Sleep hygiene: Good  HISTORY:  Onset: 22 years old Location:  Unilateral either side Quality:  throbbing Initial intensity:  Moderate (severe if during menstrual period).  She denies new headache, thunderclap headache or severe headache that wakes her from sleep. Aura:  no Prodrome:  no Postdrome:  no Associated symptoms: Nausea, photophobia, phonophobia, rarely vomiting.  She denies associated osmophobia, autonomic symptoms,  visual disturbance or unilateral numbness or weakness. Initial duration:  Until she falls asleep Initial Frequency:  Once a week to once every other week (severe during her menstrual period for 3 days) Initial Frequency of abortive medication: once a week to once every other week Triggers: Menstrual period, sleep deprivation Exacerbating factors:  Menstrual period, sleep deprivation Relieving factors:  Sleep, peppermint oil, ice packs Activity:  Aggravates, some months cannot work for a day  She was seen in the ED on 03/27/18 for migraine and pneumonia.  This was a different migraine.  It was more severe, bilateral/back of neck and lasted 3 days.  It did not respond to sumatriptan 51m.  She was treated with a headache cocktail and prescribed doxycycline.  Past NSAIDS:  Aleve (variable efficacy) Past analgesics:  Tylenol Past abortive triptans:  no Past abortive ergotamine:  no Past muscle relaxants:  Cyclobenzaprine (ineffective) Past anti-emetic:  no Past antihypertensive medications:  no Past antidepressant medications:  amitriptyline (took for a week, did not like the way it made her feel). Past anticonvulsant medications:  no Past anti-CGRP:  no Past vitamins/Herbal/Supplements:  no Past antihistamines/decongestants:  no Other past therapies:  no  Family history of headache:  Paternal aunt  PAST MEDICAL HISTORY: Past Medical History:  Diagnosis Date  . Celiac disease 2011  . Migraine   . Pneumonia 2019 aug    MEDICATIONS: Current Outpatient Medications on File Prior to Visit  Medication Sig Dispense Refill  . cetirizine (ZYRTEC) 10 MG tablet Take 10 mg by mouth daily.    .Marland Kitchendextromethorphan-guaiFENesin (MUCINEX DM) 30-600 MG 12hr tablet Take 1 tablet by mouth 2 (two) times daily.    . fluconazole (DIFLUCAN) 150 MG tablet Take 1 tablet (150 mg total) by mouth daily. (Patient not taking: Reported on 08/09/2018) 3 tablet 3  . Multiple Vitamin (MULTIVITAMIN) tablet Take 1  tablet by mouth daily.    . naproxen (NAPROSYN) 500 MG  tablet Take 1 tab twice daily for 2 weeks, beginning one week prior to first day of menstrual period. 28 tablet 3  . Norethin Ace-Eth Estrad-FE 1-20 MG-MCG(24) CHEW CHEW 1 TABLET DAILY 84 tablet 4   No current facility-administered medications on file prior to visit.     ALLERGIES: Allergies  Allergen Reactions  . Penicillins Rash    FAMILY HISTORY: Family History  Problem Relation Age of Onset  . Breast cancer Paternal Aunt   . Cancer Maternal Grandfather        lung    SOCIAL HISTORY: Social History   Socioeconomic History  . Marital status: Single    Spouse name: Not on file  . Number of children: Not on file  . Years of education: Not on file  . Highest education level: Some college, no degree  Occupational History  . Occupation: CNA    Employer: [employer]  . Financial resource strain: Not on file  . Food insecurity:    Worry: Not on file    Inability: Not on file  . Transportation needs:    Medical: Not on file    Non-medical: Not on file  Tobacco Use  . Smoking status: Never Smoker  . Smokeless tobacco: Never Used  Substance and Sexual Activity  . Alcohol use: Yes    Comment: OCC  . Drug use: No  . Sexual activity: Not Currently    Birth control/protection: Pill    Comment: 1ST intercourse- 17, partners- 2,  Lifestyle  . Physical activity:    Days per week: Not on file    Minutes per session: Not on file  . Stress: Not on file  Relationships  . Social connections:    Talks on phone: Not on file    Gets together: Not on file    Attends religious service: Not on file    Active member of club or organization: Not on file    Attends meetings of clubs or organizations: Not on file    Relationship status: Not on file  . Intimate partner violence:    Fear of current or ex partner: Not on file    Emotionally abused: Not on file    Physically abused: Not on file    Forced sexual  activity: Not on file  Other Topics Concern  . Not on file  Social History Narrative   Patient is right handed. She lives with her mother. She drinks one soda a day. She walks once a week, though states she usually makes 10,000 steps a day on her fit bit. 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    REVIEW OF SYSTEMS: Constitutional: No fevers, chills, or sweats, no generalized fatigue, change in appetite Eyes: No visual changes, double vision, eye pain Ear, nose and throat: No hearing loss, ear pain, nasal congestion, sore throat Cardiovascular: No chest pain, palpitations Respiratory:  No shortness of breath at rest or with exertion, wheezes GastrointestinaI: No nausea, vomiting, diarrhea, abdominal pain, fecal incontinence Genitourinary:  No dysuria, urinary retention or frequency Musculoskeletal:  No neck pain, back pain Integumentary: No rash, pruritus, skin lesions Neurological: as above Psychiatric: No depression, insomnia, anxiety Endocrine: No palpitations, fatigue, diaphoresis, mood swings, change in appetite, change in weight, increased thirst Hematologic/Lymphatic:  No purpura, petechiae. Allergic/Immunologic: no itchy/runny eyes, nasal congestion, recent allergic reactions, rashes  PHYSICAL EXAM: Blood pressure 106/72, pulse (!) 101, height 5' 7"  (1.702 m), weight 236 lb (107 kg), last menstrual period 07/20/2018, SpO2  98 %. General: No acute distress.  Patient appears well-groomed.   Head:  Normocephalic/atraumatic Eyes:  Fundi examined but not visualized Neck: supple, no paraspinal tenderness, full range of motion Heart:  Regular rate and rhythm Lungs:  Clear to auscultation bilaterally Back: No paraspinal tenderness Neurological Exam: alert and oriented to person, place, and time. Attention span and concentration intact, recent and remote memory intact, fund of knowledge intact.  Speech fluent and not dysarthric, language intact.  CN II-XII intact. Bulk and tone  normal, muscle strength 5/5 throughout.  Sensation to light touch intact.  Deep tendon reflexes 2+ throughout, toes downgoing.  Finger to nose testing intact.  Gait normal, Romberg negative.  IMPRESSION: Menstrual related migraines  PLAN: 1.  Continue naproxen perimenstrual prophylaxis, 500 mg twice daily for 14 days, beginning 7 days prior to menstrual period. 2.  To help prevent recurrence of migraine later in the day, advised to take naproxen 551m with sumatriptan 1080m  May repeat once in 2 hours if needed, no more than 2 doses in 24 hours.  However, if she needs to treat a migraine while on the naproxen prophylaxis, I advised to take sumatriptan alone. 3.  Limit use of pain relievers to no more than 2 days out of week to prevent risk of rebound or medication-overuse headache. 4.  Keep headache diary 5.  She will consider the following migraine preventative medications: Propranolol, nortriptyline, topiramate. 6.  Follow-up in 4 months.  AdMetta ClinesDO  CC: CoMarda StalkerPA-C

## 2018-08-15 ENCOUNTER — Other Ambulatory Visit: Payer: Self-pay

## 2018-08-15 MED ORDER — NAPROXEN 500 MG PO TABS: 500.0000 mg | ORAL_TABLET | ORAL | 5 refills | Status: DC

## 2018-08-16 ENCOUNTER — Ambulatory Visit (INDEPENDENT_AMBULATORY_CARE_PROVIDER_SITE_OTHER): Payer: 59 | Admitting: Pulmonary Disease

## 2018-08-16 ENCOUNTER — Encounter: Payer: Self-pay | Admitting: Neurology

## 2018-08-16 ENCOUNTER — Ambulatory Visit (INDEPENDENT_AMBULATORY_CARE_PROVIDER_SITE_OTHER): Payer: 59 | Admitting: Neurology

## 2018-08-16 VITALS — BP 106/72 | HR 101 | Ht 67.0 in | Wt 236.0 lb

## 2018-08-16 DIAGNOSIS — R053 Chronic cough: Secondary | ICD-10-CM

## 2018-08-16 DIAGNOSIS — R05 Cough: Secondary | ICD-10-CM | POA: Diagnosis not present

## 2018-08-16 DIAGNOSIS — G43829 Menstrual migraine, not intractable, without status migrainosus: Secondary | ICD-10-CM | POA: Diagnosis not present

## 2018-08-16 LAB — PULMONARY FUNCTION TEST
DL/VA % pred: 92 %
DL/VA: 4.75 ml/min/mmHg/L
DLCO unc % pred: 75 %
DLCO unc: 21.46 ml/min/mmHg
FEF 25-75 POST: 3.93 L/s
FEF 25-75 Pre: 3.48 L/sec
FEF2575-%Change-Post: 13 %
FEF2575-%Pred-Post: 102 %
FEF2575-%Pred-Pre: 90 %
FEV1-%Change-Post: 3 %
FEV1-%Pred-Post: 90 %
FEV1-%Pred-Pre: 87 %
FEV1-Post: 3.24 L
FEV1-Pre: 3.13 L
FEV1FVC-%Change-Post: 3 %
FEV1FVC-%PRED-PRE: 97 %
FEV6-%Change-Post: 0 %
FEV6-%Pred-Post: 89 %
FEV6-%Pred-Pre: 89 %
FEV6-Post: 3.73 L
FEV6-Pre: 3.72 L
FEV6FVC-%Pred-Post: 100 %
FEV6FVC-%Pred-Pre: 100 %
FVC-%Change-Post: 0 %
FVC-%Pred-Post: 89 %
FVC-%Pred-Pre: 89 %
FVC-Post: 3.73 L
FVC-Pre: 3.72 L
POST FEV1/FVC RATIO: 87 %
Post FEV6/FVC ratio: 100 %
Pre FEV1/FVC ratio: 84 %
Pre FEV6/FVC Ratio: 100 %
RV % pred: 93 %
RV: 1.31 L
TLC % pred: 91 %
TLC: 5.06 L

## 2018-08-16 MED ORDER — NAPROXEN 500 MG PO TABS: 500.0000 mg | ORAL_TABLET | ORAL | 5 refills | Status: DC

## 2018-08-16 NOTE — Progress Notes (Signed)
PFT done today. 

## 2018-08-16 NOTE — Patient Instructions (Addendum)
1.  Take naproxen 526m twice daily for 14 days, beginning 7 days prior to first day of menstrual period. 2.  At earliest onset of migraine, may take sumatriptan 1066mwith napoxen 50089m May repeat dose once after 2 hours if needed, no more than 2 doses in 24 hours.  If you have a migraine while taking the naproxen prophylaxis, take sumatriptan only.  Limit use of pain relievers to no more than 2 days out of week to prevent risk of rebound or medication-overuse headache. 3.  Consider the following preventative medications:  Propranolol, nortriptyline, topiramate 4.  Keep headache diary 5.  Follow up in 4 months.

## 2018-08-17 ENCOUNTER — Ambulatory Visit (INDEPENDENT_AMBULATORY_CARE_PROVIDER_SITE_OTHER): Payer: 59 | Admitting: Primary Care

## 2018-08-17 ENCOUNTER — Ambulatory Visit (INDEPENDENT_AMBULATORY_CARE_PROVIDER_SITE_OTHER)
Admission: RE | Admit: 2018-08-17 | Discharge: 2018-08-17 | Disposition: A | Discharge status: Home / Self Care | Payer: 59 | Source: Ambulatory Visit | Attending: Primary Care | Admitting: Primary Care

## 2018-08-17 ENCOUNTER — Encounter: Payer: Self-pay | Admitting: Primary Care

## 2018-08-17 ENCOUNTER — Telehealth: Payer: Self-pay

## 2018-08-17 VITALS — BP 118/72 | HR 99 | Temp 97.7°F | Ht 67.0 in | Wt 237.0 lb

## 2018-08-17 DIAGNOSIS — R05 Cough: Secondary | ICD-10-CM | POA: Diagnosis not present

## 2018-08-17 DIAGNOSIS — J189 Pneumonia, unspecified organism: Secondary | ICD-10-CM

## 2018-08-17 DIAGNOSIS — R053 Chronic cough: Secondary | ICD-10-CM

## 2018-08-17 LAB — CBC WITH DIFFERENTIAL/PLATELET
Basophils Absolute: 0 10*3/uL (ref 0.0–0.1)
Basophils Relative: 0.3 % (ref 0.0–3.0)
EOS PCT: 2.3 % (ref 0.0–5.0)
Eosinophils Absolute: 0.2 10*3/uL (ref 0.0–0.7)
HCT: 40.5 % (ref 36.0–46.0)
Hemoglobin: 13.7 g/dL (ref 12.0–15.0)
Lymphocytes Relative: 22 % (ref 12.0–46.0)
Lymphs Abs: 2.2 10*3/uL (ref 0.7–4.0)
MCHC: 33.8 g/dL (ref 30.0–36.0)
MCV: 84.3 fl (ref 78.0–100.0)
Monocytes Absolute: 0.4 10*3/uL (ref 0.1–1.0)
Monocytes Relative: 4.1 % (ref 3.0–12.0)
Neutro Abs: 7.3 10*3/uL (ref 1.4–7.7)
Neutrophils Relative %: 71.3 % (ref 43.0–77.0)
Platelets: 347 10*3/uL (ref 150.0–400.0)
RBC: 4.8 Mil/uL (ref 3.87–5.11)
RDW: 13.7 % (ref 11.5–15.5)
WBC: 10.2 10*3/uL (ref 4.0–10.5)

## 2018-08-17 LAB — NITRIC OXIDE: FeNO level (ppb): 10

## 2018-08-17 MED ORDER — FLUTICASONE PROPIONATE (INHAL) 50 MCG/BLIST IN AEPB: 1.0000 | INHALATION_SPRAY | Freq: Two times a day (BID) | RESPIRATORY_TRACT | 12 refills | Status: DC

## 2018-08-17 NOTE — Assessment & Plan Note (Addendum)
-   PFTs with no overt obstruction or restriction on spirometry, however, slight concavity to flow volume loop consistent with mild obstructive airway defect - FVC 3.73 (89%), FEV1 3.24 (90%), ratio 87 - Trial Flovent 50 mcg 1 puff twice a day  - FENO 10, normal - Checking CBC with diff and IgE

## 2018-08-17 NOTE — Progress Notes (Signed)
Please contact patient regarding test results:  PFTs demonstrate mild obstructive airway defect that may represent mild asthma. Recommend starting a low dose steroid inhaler (Fluticasone propionate DPI 50 mcg 1 puff twice a day) and follow-up with me in 3 months. She is being seen by Geraldo Pitter today.

## 2018-08-17 NOTE — Telephone Encounter (Signed)
Spoke with pt and instructed to arrive 10-15 minutes early for next appointment and get chest x-ray before visit.  Pt agreed.  Nothing further needed.

## 2018-08-17 NOTE — Progress Notes (Addendum)
@Patient  ID: Susan Elliott, female    DOB: 05-Jul-1996, 22 y.o.   MRN: [id number]  Chief Complaint  Patient presents with  . Follow-up    cough about the same with clear to green mucus, SOB on exertion    Referring provider: [name], PA-C  HPI: 22 year old female, never smoked. PMH significant for re-current pneumonia. Patient of Dr. Loanne Drilling, seen for initial consult last week. Ordered for PFTs.   08/25/2018 Patient presents today for follow-up visit to review PFTs. Feels well today, complains of slight chronic cough and mucus production. She has history of pneumonia, last treated with abx and steroid in november no follow up chest xray. States that her typical symptoms when she's acutely sick vary from chest tightness, wheezing, back pain and cough. Denies aspiration. She does not smoke. Passive smoke exposure, grandfather smoked but she did not live with him. She has two dogs. No reported allergies except to gluten and penicillin. Spirometry appeared normal with no overt obstruction or restriction. Slight concavity to flow volume loop consistent with mild obstructive airway defect. FENO 10  Allergies  Allergen Reactions  . Penicillins Rash    Immunization History  Administered Date(s) Administered  . Influenza,inj,Quad PF,6+ Mos 06/11/2018    Past Medical History:  Diagnosis Date  . Celiac disease 2011  . Migraine   . Pneumonia 2019 aug    Tobacco History: Social History   Tobacco Use  Smoking Status Never Smoker  Smokeless Tobacco Never Used   Counseling given: Not Answered   Outpatient Medications Prior to Visit  Medication Sig Dispense Refill  . cetirizine (ZYRTEC) 10 MG tablet Take 10 mg by mouth daily.    Marland Kitchen dextromethorphan-guaiFENesin (MUCINEX DM) 30-600 MG 12hr tablet Take 1 tablet by mouth 2 (two) times daily.    . Multiple Vitamin (MULTIVITAMIN) tablet Take 1 tablet by mouth daily.    . naproxen (NAPROSYN) 500 MG tablet Take 1 tablet (500 mg  total) by mouth as directed. Take 1 tab twice daily for 2 weeks, beginning one week prior to first day of menstrual period.  Also, may take every 8 hours as needed. 40 tablet 5  . Norethin Ace-Eth Estrad-FE 1-20 MG-MCG(24) CHEW CHEW 1 TABLET DAILY 84 tablet 4  . fluconazole (DIFLUCAN) 150 MG tablet Take 1 tablet (150 mg total) by mouth daily. (Patient not taking: Reported on 08/17/2018) 3 tablet 3   No facility-administered medications prior to visit.     Review of Systems  Review of Systems  Constitutional: Negative.   HENT: Negative.   Respiratory: Positive for cough. Negative for chest tightness, shortness of breath and wheezing.   Cardiovascular: Negative.     Physical Exam  BP 118/72 (BP Location: Right Arm, Cuff Size: Normal)   Pulse 99   Temp 97.7 F (36.5 C)   Ht 5\' 7"  (1.702 m)   Wt 237 lb (107.5 kg)   LMP 07/20/2018 Comment: PILL  SpO2 98%   BMI 37.12 kg/m  Physical Exam Constitutional:      Appearance: She is well-developed.  HENT:     Head: Normocephalic and atraumatic.  Eyes:     Pupils: Pupils are equal, round, and reactive to light.  Neck:     Musculoskeletal: Normal range of motion and neck supple.  Cardiovascular:     Rate and Rhythm: Normal rate and regular rhythm.     Heart sounds: Normal heart sounds. No murmur.  Pulmonary:     Effort: Pulmonary effort is normal.  No respiratory distress.     Breath sounds: Normal breath sounds. No wheezing.     Comments: CTA, diminished bases Skin:    General: Skin is warm and dry.     Findings: No erythema or rash.  Neurological:     Mental Status: She is alert and oriented to person, place, and time.  Psychiatric:        Behavior: Behavior normal.        Judgment: Judgment normal.      Lab Results:  CBC    Component Value Date/Time   WBC 10.2 08/17/2018 1010   RBC 4.80 08/17/2018 1010   HGB 13.7 08/17/2018 1010   HCT 40.5 08/17/2018 1010   PLT 347.0 08/17/2018 1010   MCV 84.3 08/17/2018 1010    MCHC 33.8 08/17/2018 1010   RDW 13.7 08/17/2018 1010   LYMPHSABS 2.2 08/17/2018 1010   MONOABS 0.4 08/17/2018 1010   EOSABS 0.2 08/17/2018 1010   BASOSABS 0.0 08/17/2018 1010    BMET No results found for: NA, K, CL, CO2, GLUCOSE, BUN, CREATININE, CALCIUM, GFRNONAA, GFRAA  BNP No results found for: BNP  ProBNP No results found for: PROBNP  Imaging: Dg Chest 2 View  Result Date: 08/17/2018 CLINICAL DATA:  Recent pneumonia.  Cough EXAM: CHEST - 2 VIEW COMPARISON:  July 17, 2018 and April 25, 2018 FINDINGS: There has been partial clearing of consolidation in portions of the right middle and lower lobes. There remains atelectatic change in the right base. There is no new opacity evident elsewhere. Heart size and pulmonary vascularity are normal. No adenopathy. No bone lesions. IMPRESSION: Significant partial clearing of consolidation from portions of the right middle and lower lobes. Patchy atelectasis remains in these areas. No new opacity evident. Stable cardiac silhouette. No evident adenopathy. Electronically Signed   By: Lowella Grip III M.D.   On: 08/17/2018 10:50     Assessment & Plan:   Pneumonia due to Streptococcus pneumoniae ([place]) - Recurrent right sided pneumonia, most recent antibiotic course in November 2019 - CXR today showed significant partial clearing of consolidation from portions of the right middle and lower lobes. Patchy atelectasis remains in these areas - Plan to repeat CXR in 4 weeks, if CXR does not fully clear then would consider CT CHEST  - May also need SNIFF test to assess persistent right hemidiaphragm elevation (this could be r/t to post viral phrenic nerve paralysis)  - Encourage deep breathing exercises  - FU with Dr. Loanne Drilling in 4-6 weeks   Chronic cough - PFTs with no overt obstruction or restriction on spirometry, however, slight concavity to flow volume loop consistent with mild obstructive airway defect - FVC 3.73 (89%), FEV1 3.24 (90%),  ratio 87 - Trial Flovent 50 mcg 1 puff twice a day  - FENO 10, normal - Checking CBC with diff and IgE    Martyn Ehrich, NP 08/25/2018

## 2018-08-17 NOTE — Assessment & Plan Note (Addendum)
-   Recurrent right sided pneumonia, most recent antibiotic course in November 2019 - CXR today showed significant partial clearing of consolidation from portions of the right middle and lower lobes. Patchy atelectasis remains in these areas - Plan to repeat CXR in 4 weeks, if CXR does not fully clear then would consider CT CHEST  - May also need SNIFF test to assess persistent right hemidiaphragm elevation (this could be r/t to post viral phrenic nerve paralysis)  - Encourage deep breathing exercises  - FU with Dr. Loanne Drilling in 4-6 weeks

## 2018-08-17 NOTE — Patient Instructions (Addendum)
FENO was normal  CXR today   Labs today- checking CBC with Diff and IgE  Start Flovent 50- take 1 puff twice a day   Practice cough and deep breathing exercises   FU with Dr. Loanne Drilling in 4-6 weeks

## 2018-08-20 LAB — IGE: IgE (Immunoglobulin E), Serum: 7 kU/L (ref <=114)

## 2018-09-14 ENCOUNTER — Encounter: Payer: Self-pay | Admitting: Primary Care

## 2018-09-14 ENCOUNTER — Ambulatory Visit (INDEPENDENT_AMBULATORY_CARE_PROVIDER_SITE_OTHER): Payer: 59 | Admitting: Primary Care

## 2018-09-14 ENCOUNTER — Ambulatory Visit (INDEPENDENT_AMBULATORY_CARE_PROVIDER_SITE_OTHER)
Admission: RE | Admit: 2018-09-14 | Discharge: 2018-09-14 | Disposition: A | Discharge status: Home / Self Care | Payer: 59 | Source: Ambulatory Visit | Attending: Primary Care | Admitting: Primary Care

## 2018-09-14 ENCOUNTER — Other Ambulatory Visit: Payer: Self-pay | Admitting: *Deleted

## 2018-09-14 VITALS — BP 128/86 | HR 74 | Temp 98.1°F | Ht 67.0 in | Wt 235.8 lb

## 2018-09-14 DIAGNOSIS — R0600 Dyspnea, unspecified: Secondary | ICD-10-CM | POA: Diagnosis not present

## 2018-09-14 DIAGNOSIS — J986 Disorders of diaphragm: Secondary | ICD-10-CM | POA: Insufficient documentation

## 2018-09-14 DIAGNOSIS — J189 Pneumonia, unspecified organism: Secondary | ICD-10-CM | POA: Insufficient documentation

## 2018-09-14 DIAGNOSIS — R0602 Shortness of breath: Secondary | ICD-10-CM

## 2018-09-14 DIAGNOSIS — J181 Lobar pneumonia, unspecified organism: Secondary | ICD-10-CM

## 2018-09-14 NOTE — Assessment & Plan Note (Addendum)
-   Repeat CXR 09/14/2018 showed persistent mild elevation of right diaphragm, possibly d/t post viral phrenic nerve paralysis from recent pneumonia. Likely causing some restriction in breathing and shortness of breath symptoms. - Needs SNIFF test - Ok to continue Flovent as needed twice daily  - Encouraged patient to keep weight stable  - If no improvement in 1 year (July-Novemeber 2020), refer to [facility]

## 2018-09-14 NOTE — Assessment & Plan Note (Signed)
-   Right lung base opacity is similar to the most recent prior exam.This could reflect residual pneumonia. However, given the stability of this appearance, chronic atelectasis - Needs Chest CT w/o contrast to better evaluate

## 2018-09-14 NOTE — Progress Notes (Addendum)
_0  ID: Susan Elliott, female    DOB: 1995-09-22, 22 y.o.   MRN: [id number]  Chief Complaint  Patient presents with  . Follow-up    feeling better, no SOB    Referring provider: [name], PA-C  HPI: 23 year old female, never smoked. PMH significant for re-current pneumonia. Patient of Dr. Loanne Elliott, seen for initial consult in December 2019.   Previous [facility] Encounters: 08/09/18- Consult, Dr. Loanne Drilling [name] is a 23 year old female with celiac disease who presents to pulmonary clinic as a new consult for shortness of breath. Records from [facility] were reviewed and summarized as follows: Patient was seen in ER in July 2019 and diagnosed with pneumonia secondary to strep pneumonia. For the last several months, she has persistent chest tightness and shortness of breath with exertion. Associated with nonproductive coughing. Denies wheezing. Symptoms worse with illness and activity. Improves with rest.  Has previously tried an inhaler in the past however not improved. She has had three rounds of antibiotics since July which temporarily improves her symptoms however they return after a few weeks he has some done. She has mild allergies. Denies sinus pain. No personal or family hx of asthma. Environmental exposures: None Works in nursing home   08/25/2018- Susan Kayser NP Patient presents today for follow-up visit to review PFTs. Feels well today, complains of slight chronic cough and mucus production. She has history of pneumonia, last treated with abx and steroid in november no follow up chest xray. States that her typical symptoms when she's acutely sick vary from chest tightness, wheezing, back pain and cough. Denies aspiration. She does not smoke. Passive smoke exposure, grandfather smoked but she did not live with him. She has two dogs. No reported allergies except to gluten and penicillin.   CXR showed significant partial clearing of  consolidation from portions of the right middle and lower lobes. Patchy atelectasis remains in these areas, plan repeat CXR in 4 weeks if CXR does not fully clear could then consider CT chest. May also need SNIFF test to assess persistent right hemidiaphragm elevation which could be r/t post viral phrenic nerve paralysis. Encouraged deep breathing exercises. PFTs showed not overt obstruction or restriction on spirometry, howevere, slight concavity to flow volume loop. Trial Flovent 50 MCG 1 puff twice daily. FENO 10.  IgE 7, Eos absolute 0.2.   09/14/2018 Patient presents today for 4 week follow-up. Reports that she is feeling much better. No shortness of breath or cough. Used Flovent but did not notice much difference. No smoking or vaping. Afebrile.    Allergies  Allergen Reactions  . Penicillins Rash    Immunization History  Administered Date(s) Administered  . Influenza,inj,Quad PF,6+ Mos 06/11/2018    Past Medical History:  Diagnosis Date  . Celiac disease 2011  . Migraine   . Pneumonia 2019 aug  . Pneumonia due to Streptococcus pneumoniae (Susan Elliott) 08/09/2018    Tobacco History: Social History   Tobacco Use  Smoking Status Never Smoker  Smokeless Tobacco Never Used   Counseling given: Not Answered   Outpatient Medications Prior to Visit  Medication Sig Dispense Refill  . cetirizine (ZYRTEC) 10 MG tablet Take 10 mg by mouth daily.    . fluticasone (FLOVENT DISKUS) 50 MCG/BLIST diskus inhaler Inhale 1 puff into the lungs 2 (two) times daily. 1 Inhaler 12  . Multiple Vitamin (MULTIVITAMIN) tablet Take 1 tablet by mouth daily.    . naproxen (NAPROSYN) 500 MG tablet Take 1 tablet (  500 mg total) by mouth as directed. Take 1 tab twice daily for 2 weeks, beginning one week prior to first day of menstrual period.  Also, may take every 8 hours as needed. 40 tablet 5  . Norethin Ace-Eth Estrad-FE 1-20 MG-MCG(24) CHEW CHEW 1 TABLET DAILY 84 tablet 4  . dextromethorphan-guaiFENesin  (MUCINEX DM) 30-600 MG 12hr tablet Take 1 tablet by mouth 2 (two) times daily.    . fluconazole (DIFLUCAN) 150 MG tablet Take 1 tablet (150 mg total) by mouth daily. 3 tablet 3   No facility-administered medications prior to visit.     Review of Systems  Review of Systems  Constitutional: Negative.   HENT: Negative.   Respiratory: Negative.   Cardiovascular: Negative.     Physical Exam  BP 128/86 (BP Location: Left Arm, Cuff Size: Normal)   Pulse 74   Temp 98.1 F (36.7 C)   Ht _0  (1.702 m)   Wt 235 lb 12.8 oz (107 kg)   LMP 09/07/2018 (Exact Date)   SpO2 99%   BMI 36.93 kg/m  Physical Exam Constitutional:      Appearance: She is well-developed.  HENT:     Head: Normocephalic and atraumatic.  Eyes:     Pupils: Pupils are equal, round, and reactive to light.  Neck:     Musculoskeletal: Normal range of motion and neck supple.  Cardiovascular:     Rate and Rhythm: Normal rate and regular rhythm.     Heart sounds: Normal heart sounds. No murmur.  Pulmonary:     Effort: Pulmonary effort is normal. No respiratory distress.     Breath sounds: Normal breath sounds. No wheezing.     Comments: Diminished right lower and middle lobe  Abdominal:     General: Bowel sounds are normal.     Palpations: Abdomen is soft.     Tenderness: There is no abdominal tenderness.  Skin:    General: Skin is warm and dry.     Findings: No erythema or rash.  Neurological:     Mental Status: She is alert and oriented to person, place, and time.  Psychiatric:        Behavior: Behavior normal.        Judgment: Judgment normal.      Lab Results:  CBC    Component Value Date/Time   WBC 10.2 08/17/2018 1010   RBC 4.80 08/17/2018 1010   HGB 13.7 08/17/2018 1010   HCT 40.5 08/17/2018 1010   PLT 347.0 08/17/2018 1010   MCV 84.3 08/17/2018 1010   MCHC 33.8 08/17/2018 1010   RDW 13.7 08/17/2018 1010   LYMPHSABS 2.2 08/17/2018 1010   MONOABS 0.4 08/17/2018 1010   EOSABS 0.2  08/17/2018 1010   BASOSABS 0.0 08/17/2018 1010    BMET No results found for: NA, K, CL, CO2, GLUCOSE, BUN, CREATININE, CALCIUM, GFRNONAA, GFRAA  BNP No results found for: BNP  ProBNP No results found for: PROBNP  Imaging: Dg Chest 2 View  Result Date: 09/14/2018 CLINICAL DATA:  Short of breath.  History of pneumonia. EXAM: CHEST - 2 VIEW COMPARISON:  08/17/2018 FINDINGS: Mild right middle and lower lobe lung base opacity is similar to the most recent prior exam. There is stable mild elevation of right hemidiaphragm. Remainder of the lungs is clear. No pleural effusion or pneumothorax. Normal heart, mediastinum and hila. Skeletal structures are within normal limits. IMPRESSION: 1. Right lung base opacity is similar to the most recent prior exam. This could reflect residual  pneumonia. However, given the stability of this appearance, chronic atelectasis is suspected. 2. No new lung abnormalities. Electronically Signed   By: Lajean Manes M.D.   On: 09/14/2018 10:52   Dg Chest 2 View  Result Date: 08/17/2018 CLINICAL DATA:  Recent pneumonia.  Cough EXAM: CHEST - 2 VIEW COMPARISON:  July 17, 2018 and April 25, 2018 FINDINGS: There has been partial clearing of consolidation in portions of the right middle and lower lobes. There remains atelectatic change in the right base. There is no new opacity evident elsewhere. Heart size and pulmonary vascularity are normal. No adenopathy. No bone lesions. IMPRESSION: Significant partial clearing of consolidation from portions of the right middle and lower lobes. Patchy atelectasis remains in these areas. No new opacity evident. Stable cardiac silhouette. No evident adenopathy. Electronically Signed   By: Lowella Grip III M.D.   On: 08/17/2018 10:50     Assessment & Plan:   Elevated diaphragm - Repeat CXR 09/14/2018 showed persistent mild elevation of right diaphragm, possibly d/t post viral phrenic nerve paralysis from recent pneumonia. Likely  causing some restriction in breathing and shortness of breath symptoms. - Needs SNIFF test - Ok to continue Flovent as needed twice daily  - Encouraged patient to keep weight stable  - If no improvement in 1 year (July-Novemeber 2020), refer to [facility]  Pneumonia due to Streptococcus pneumoniae ([place]) - Right lung base opacity is similar to the most recent prior exam.This could reflect residual pneumonia. However, given the stability of this appearance, chronic atelectasis - Needs Chest CT w/o contrast to better evaluate   Addendum 09/19/2018 SNIFF test was normal. CT chest showed 1.8cm oval soft tissue structure within the right bronchus intermedius suspicious for an endobronchial mass. Associated atelectasis/ collapse of the right lower and middle lobe. Trace right pleural effusion. No abnormal lymph nodules. I contacted Dr. Loanne Elliott and made her aware of abnormal CT results. She called patient and plan is for flexible bronchoscopy.    Martyn Ehrich, NP 09/14/2018

## 2018-09-14 NOTE — Patient Instructions (Addendum)
Awaiting official read of CXR done today, right diaphragm still appears elevated likely indicating hemiparalysis of diaphragm which could be causing some restriction in your breathing (shortness of breath)  Recommendations: Deep breathing exercises Use Flovent as needed twice daily   Order: SNIFF test re: dyspnea  Follow-up: 3 months with [name] NP

## 2018-09-14 NOTE — Progress Notes (Deleted)
Synopsis: Referred in 07/2018 for persistent pneumonia  Subjective:   PATIENT ID: Susan Elliott GENDER: female DOB: April 18, 1996, MRN: [id number]   HPI  No chief complaint on file.  Susan Elliott is a 23 year old female with celiac disease who presents to pulmonary clinic as a new consult for shortness of breath.  Records from [facility] were reviewed and summarized as follows: Patient was seen in ER in July 2019 and diagnosed with pneumonia secondary to strep pneumonia.  For the last several months, she has persistent chest tightness and shortness of breath with exertion. Associated with nonproductive coughing. Denies wheezing. Symptoms worse with illness and activity. Improves with rest.  Has previously tried an inhaler in the past however not improved. She has had three rounds of antibiotics since July which temporarily improves her symptoms however they return after a few weeks he has some done. She has mild allergies. Denies sinus pain.   No personal or family hx of asthma.   Environmental exposures: None Works in nursing home   I have personally reviewed patient's past medical/family/social history, allergies, current medications.  Past Medical History:  Diagnosis Date  . Celiac disease 2011  . Migraine   . Pneumonia 2019 aug     Family History  Problem Relation Age of Onset  . Breast cancer Paternal Aunt   . Cancer Maternal Grandfather        lung     Social History   Occupational History  . Occupation: CNA    Employer: [employer]  Tobacco Use  . Smoking status: Never Smoker  . Smokeless tobacco: Never Used  Substance and Sexual Activity  . Alcohol use: Yes    Comment: OCC  . Drug use: No  . Sexual activity: Not Currently    Birth control/protection: Pill    Comment: 1ST intercourse- 17, partners- 2,    Allergies  Allergen Reactions  . Penicillins Rash     Outpatient Medications Prior to Visit  Medication Sig Dispense Refill  .  cetirizine (ZYRTEC) 10 MG tablet Take 10 mg by mouth daily.    Marland Kitchen dextromethorphan-guaiFENesin (MUCINEX DM) 30-600 MG 12hr tablet Take 1 tablet by mouth 2 (two) times daily.    . fluconazole (DIFLUCAN) 150 MG tablet Take 1 tablet (150 mg total) by mouth daily. (Patient not taking: Reported on 08/17/2018) 3 tablet 3  . fluticasone (FLOVENT DISKUS) 50 MCG/BLIST diskus inhaler Inhale 1 puff into the lungs 2 (two) times daily. 1 Inhaler 12  . Multiple Vitamin (MULTIVITAMIN) tablet Take 1 tablet by mouth daily.    . naproxen (NAPROSYN) 500 MG tablet Take 1 tablet (500 mg total) by mouth as directed. Take 1 tab twice daily for 2 weeks, beginning one week prior to first day of menstrual period.  Also, may take every 8 hours as needed. 40 tablet 5  . Norethin Ace-Eth Estrad-FE 1-20 MG-MCG(24) CHEW CHEW 1 TABLET DAILY 84 tablet 4   No facility-administered medications prior to visit.     Review of Systems  Constitutional: Negative for chills, fever, malaise/fatigue and weight loss.  HENT: Negative for congestion, sinus pain and sore throat.   Respiratory: Positive for cough and shortness of breath. Negative for hemoptysis, sputum production and wheezing.   Cardiovascular: Positive for chest pain (Chest tightness) and PND. Negative for palpitations and leg swelling.  Gastrointestinal: Negative for abdominal pain, heartburn and nausea.  Genitourinary: Negative for frequency.  Musculoskeletal: Negative for myalgias.  Skin: Negative for rash.  Neurological: Positive for  headaches. Negative for dizziness and weakness.  Endo/Heme/Allergies: Negative for environmental allergies.     Objective:  Physical Exam   There were no vitals filed for this visit.    Physical Exam: General: Well-appearing, no acute distress HENT: [place], AT, OP clear, MMM Eyes: EOMI, no scleral icterus Respiratory: Clear to auscultation bilaterally.  No crackles, wheezing or rales Cardiovascular: RRR, -M/R/G, no JVD GI: BS+,  soft, nontender Extremities:-Edema,-tenderness Neuro: AAO x4, CNII-XII grossly intact Skin: Intact, no rashes or bruising Psych: Normal mood, normal affect  Chest imaging: None on file  PFT: None on file  I have personally reviewed the above labs, images and tests noted above.    Assessment & Plan:   Discussion: 23 year old female with history of recent pneumonia status post antibiotics and steroids x2 associated with chronic nonproductive cough.  Cough has not resolved.  #Chronic cough Cough has been ongoing for > 8 weeks however has had interval pneumonia.  This may be postinfectious cough however with symptoms of chest tightness, I am concerned about asthma.  Minimal allergy and reflux symptoms. --We will obtain full pulmonary function test to evaluate for asthma  #Recent pneumonia No evidence of acute illness on exam --No indications for treatment at this time  No orders of the defined types were placed in this encounter. No orders of the defined types were placed in this encounter.   No follow-ups on file.   Thank you for choosing [facility] for your health needs!   Chi Rodman Pickle, MD [facility] 09/14/2018 9:51 AM  Personal pager: [phone] If unanswered, please page CCM On-call: [phone]

## 2018-09-14 NOTE — Assessment & Plan Note (Deleted)
-   Right lung base opacity is similar to the most recent prior exam.This could reflect residual pneumonia. However, given the stability of this appearance, chronic atelectasis - Needs CT chest w/o contrast

## 2018-09-18 ENCOUNTER — Ambulatory Visit (INDEPENDENT_AMBULATORY_CARE_PROVIDER_SITE_OTHER)
Admission: RE | Admit: 2018-09-18 | Discharge: 2018-09-18 | Disposition: A | Discharge status: Home / Self Care | Payer: 59 | Source: Ambulatory Visit | Attending: Primary Care | Admitting: Primary Care

## 2018-09-18 ENCOUNTER — Ambulatory Visit (HOSPITAL_COMMUNITY)
Admission: RE | Admit: 2018-09-18 | Discharge: 2018-09-18 | Disposition: A | Discharge status: Home / Self Care | Payer: 59 | Source: Ambulatory Visit | Attending: Primary Care | Admitting: Primary Care

## 2018-09-18 DIAGNOSIS — R0602 Shortness of breath: Secondary | ICD-10-CM | POA: Diagnosis not present

## 2018-09-18 DIAGNOSIS — R0600 Dyspnea, unspecified: Secondary | ICD-10-CM | POA: Insufficient documentation

## 2018-09-19 ENCOUNTER — Other Ambulatory Visit: Payer: Self-pay | Admitting: Pulmonary Disease

## 2018-09-19 ENCOUNTER — Telehealth: Payer: Self-pay | Admitting: Pulmonary Disease

## 2018-09-19 DIAGNOSIS — R918 Other nonspecific abnormal finding of lung field: Secondary | ICD-10-CM

## 2018-09-19 NOTE — Telephone Encounter (Signed)
I have already spoken with patient. Susan Elliott will call with procedure directions and information.

## 2018-09-19 NOTE — Addendum Note (Signed)
Addended by: Rodman Pickle on: 09/19/2018 09:56 AM   Modules accepted: Orders, SmartSet

## 2018-09-19 NOTE — Progress Notes (Signed)
I have contacted patient regarding CT lung results which demonstrate right-sided endobronchial mass. Will arrange for flexible bronchoscopy.

## 2018-09-19 NOTE — Telephone Encounter (Signed)
Called and spoke with patient, she stated that Dr. Loanne Drilling has already contacted her in regards to scheduling her bronch. Dr. Loanne Drilling please advise on this, thank you.

## 2018-09-19 NOTE — Telephone Encounter (Signed)
Contacted patient by phone - reminder to hold ibuprofen and migraine medication which has aspirin until procedure is completed (bronch).  Patient is aware of NPO status for procedure - has spoken to [facility] staff - and she has arranged for a driver the day of procedure.  If further questions regarding procedure time of arrival (she was unclear) patient was instructed to contact Zac[facility] Pontes tomorrow if she has not heard anything.  Patient acknowledged understanding.  Nothing further needed at this time.

## 2018-09-20 ENCOUNTER — Encounter (HOSPITAL_COMMUNITY): Payer: Self-pay | Admitting: *Deleted

## 2018-09-20 ENCOUNTER — Other Ambulatory Visit: Payer: Self-pay

## 2018-09-21 ENCOUNTER — Ambulatory Visit (HOSPITAL_COMMUNITY): Payer: 59

## 2018-09-21 ENCOUNTER — Ambulatory Visit (HOSPITAL_COMMUNITY): Payer: 59 | Admitting: Certified Registered"

## 2018-09-21 ENCOUNTER — Encounter (HOSPITAL_COMMUNITY)
Admission: RE | Disposition: A | Discharge status: Home / Self Care | Payer: Self-pay | Source: Home / Self Care | Attending: Pulmonary Disease

## 2018-09-21 ENCOUNTER — Ambulatory Visit (HOSPITAL_COMMUNITY)
Admission: RE | Admit: 2018-09-21 | Discharge: 2018-09-21 | Disposition: A | Discharge status: Home / Self Care | Payer: 59 | Attending: Pulmonary Disease | Admitting: Pulmonary Disease

## 2018-09-21 DIAGNOSIS — J45909 Unspecified asthma, uncomplicated: Secondary | ICD-10-CM | POA: Diagnosis not present

## 2018-09-21 DIAGNOSIS — G43909 Migraine, unspecified, not intractable, without status migrainosus: Secondary | ICD-10-CM | POA: Diagnosis not present

## 2018-09-21 DIAGNOSIS — J9819 Other pulmonary collapse: Secondary | ICD-10-CM | POA: Insufficient documentation

## 2018-09-21 DIAGNOSIS — K900 Celiac disease: Secondary | ICD-10-CM | POA: Insufficient documentation

## 2018-09-21 DIAGNOSIS — R222 Localized swelling, mass and lump, trunk: Secondary | ICD-10-CM | POA: Diagnosis present

## 2018-09-21 DIAGNOSIS — Z79899 Other long term (current) drug therapy: Secondary | ICD-10-CM | POA: Insufficient documentation

## 2018-09-21 DIAGNOSIS — R918 Other nonspecific abnormal finding of lung field: Secondary | ICD-10-CM

## 2018-09-21 DIAGNOSIS — Z880 Allergy status to penicillin: Secondary | ICD-10-CM | POA: Insufficient documentation

## 2018-09-21 DIAGNOSIS — Z9889 Other specified postprocedural states: Secondary | ICD-10-CM

## 2018-09-21 HISTORY — PX: VIDEO BRONCHOSCOPY: SHX5072

## 2018-09-21 HISTORY — DX: Dyspnea, unspecified: R06.00

## 2018-09-21 HISTORY — DX: Unspecified asthma, uncomplicated: J45.909

## 2018-09-21 LAB — CBC
HEMATOCRIT: 41.5 % (ref 36.0–46.0)
HEMOGLOBIN: 13.2 g/dL (ref 12.0–15.0)
MCH: 27.9 pg (ref 26.0–34.0)
MCHC: 31.8 g/dL (ref 30.0–36.0)
MCV: 87.7 fL (ref 80.0–100.0)
Platelets: 257 10*3/uL (ref 150–400)
RBC: 4.73 MIL/uL (ref 3.87–5.11)
RDW: 13 % (ref 11.5–15.5)
WBC: 12.1 10*3/uL — ABNORMAL HIGH (ref 4.0–10.5)
nRBC: 0 % (ref 0.0–0.2)

## 2018-09-21 SURGERY — BRONCHOSCOPY, VIDEO-ASSISTED
Anesthesia: General | Site: Chest

## 2018-09-21 MED ORDER — PROMETHAZINE HCL 25 MG/ML IJ SOLN
6.2500 mg | INTRAMUSCULAR | Status: DC | PRN
  Administered 2018-09-21: 6.25 mg via INTRAVENOUS

## 2018-09-21 MED ORDER — ROCURONIUM BROMIDE 50 MG/5ML IV SOSY
PREFILLED_SYRINGE | INTRAVENOUS | Status: DC | PRN
  Administered 2018-09-21: 50 mg via INTRAVENOUS

## 2018-09-21 MED ORDER — PROPOFOL 10 MG/ML IV BOLUS
INTRAVENOUS | Status: DC | PRN
  Administered 2018-09-21: 200 mg via INTRAVENOUS

## 2018-09-21 MED ORDER — DEXAMETHASONE SODIUM PHOSPHATE 10 MG/ML IJ SOLN
INTRAMUSCULAR | Status: AC
  Filled 2018-09-21: qty 1

## 2018-09-21 MED ORDER — ONDANSETRON HCL 4 MG/2ML IJ SOLN
INTRAMUSCULAR | Status: DC | PRN
  Administered 2018-09-21: 4 mg via INTRAVENOUS

## 2018-09-21 MED ORDER — MIDAZOLAM HCL 5 MG/5ML IJ SOLN
INTRAMUSCULAR | Status: DC | PRN
  Administered 2018-09-21: 2 mg via INTRAVENOUS

## 2018-09-21 MED ORDER — LIDOCAINE 2% (20 MG/ML) 5 ML SYRINGE
INTRAMUSCULAR | Status: AC
  Filled 2018-09-21: qty 5

## 2018-09-21 MED ORDER — FENTANYL CITRATE (PF) 100 MCG/2ML IJ SOLN: 25.0000 ug | INTRAMUSCULAR | Status: DC | PRN

## 2018-09-21 MED ORDER — LACTATED RINGERS IV SOLN
INTRAVENOUS | Status: DC
  Administered 2018-09-21: 09:00:00 via INTRAVENOUS

## 2018-09-21 MED ORDER — PROMETHAZINE HCL 25 MG/ML IJ SOLN
INTRAMUSCULAR | Status: AC
  Filled 2018-09-21: qty 1

## 2018-09-21 MED ORDER — EPINEPHRINE PF 1 MG/ML IJ SOLN
INTRAMUSCULAR | Status: AC
  Filled 2018-09-21: qty 1

## 2018-09-21 MED ORDER — MIDAZOLAM HCL 2 MG/2ML IJ SOLN
INTRAMUSCULAR | Status: AC
  Filled 2018-09-21: qty 2

## 2018-09-21 MED ORDER — FENTANYL CITRATE (PF) 100 MCG/2ML IJ SOLN
INTRAMUSCULAR | Status: DC | PRN
  Administered 2018-09-21 (×2): 50 ug via INTRAVENOUS
  Administered 2018-09-21: 100 ug via INTRAVENOUS

## 2018-09-21 MED ORDER — EPINEPHRINE PF 1 MG/ML IJ SOLN
INTRAMUSCULAR | Status: DC | PRN
  Administered 2018-09-21: 1 mg via ENDOTRACHEOPULMONARY

## 2018-09-21 MED ORDER — DEXAMETHASONE SODIUM PHOSPHATE 10 MG/ML IJ SOLN
INTRAMUSCULAR | Status: DC | PRN
  Administered 2018-09-21: 10 mg via INTRAVENOUS

## 2018-09-21 MED ORDER — SUGAMMADEX SODIUM 500 MG/5ML IV SOLN
INTRAVENOUS | Status: DC | PRN
  Administered 2018-09-21: 430 mg via INTRAVENOUS

## 2018-09-21 MED ORDER — PROPOFOL 10 MG/ML IV BOLUS
INTRAVENOUS | Status: AC
  Filled 2018-09-21: qty 20

## 2018-09-21 MED ORDER — FENTANYL CITRATE (PF) 250 MCG/5ML IJ SOLN
INTRAMUSCULAR | Status: AC
  Filled 2018-09-21: qty 5

## 2018-09-21 MED ORDER — LIDOCAINE 2% (20 MG/ML) 5 ML SYRINGE
INTRAMUSCULAR | Status: DC | PRN
  Administered 2018-09-21: 60 mg via INTRAVENOUS

## 2018-09-21 MED ORDER — ONDANSETRON HCL 4 MG/2ML IJ SOLN
INTRAMUSCULAR | Status: AC
  Filled 2018-09-21: qty 2

## 2018-09-21 MED ORDER — ROCURONIUM BROMIDE 50 MG/5ML IV SOSY
PREFILLED_SYRINGE | INTRAVENOUS | Status: AC
  Filled 2018-09-21: qty 5

## 2018-09-21 MED ORDER — 0.9 % SODIUM CHLORIDE (POUR BTL) OPTIME
TOPICAL | Status: DC | PRN
  Administered 2018-09-21: 1000 mL

## 2018-09-21 SURGICAL SUPPLY — 27 items
BALL CTTN LRG ABS STRL LF (GAUZE/BANDAGES/DRESSINGS)
BRUSH CYTOL CELLEBRITY 1.5X140 (MISCELLANEOUS) IMPLANT
CANISTER SUCT 3000ML PPV (MISCELLANEOUS) ×3 IMPLANT
CONT SPEC 4OZ CLIKSEAL STRL BL (MISCELLANEOUS) ×6 IMPLANT
COTTONBALL LRG STERILE PKG (GAUZE/BANDAGES/DRESSINGS) IMPLANT
COVER BACK TABLE 60X90IN (DRAPES) ×3 IMPLANT
COVER WAND RF STERILE (DRAPES) ×1 IMPLANT
FILTER STRAW FLUID ASPIR (MISCELLANEOUS) IMPLANT
FORCEPS BIOP RJ4 1.8 (CUTTING FORCEPS) ×2 IMPLANT
GAUZE SPONGE 4X4 12PLY STRL (GAUZE/BANDAGES/DRESSINGS) ×3 IMPLANT
GLOVE BIO SURGEON STRL SZ 6.5 (GLOVE) ×2 IMPLANT
GLOVE BIO SURGEONS STRL SZ 6.5 (GLOVE) ×1
KIT CLEAN ENDO COMPLIANCE (KITS) ×3 IMPLANT
KIT TURNOVER KIT B (KITS) ×3 IMPLANT
NEEDLE 22X1 1/2 (OR ONLY) (NEEDLE) IMPLANT
NS IRRIG 1000ML POUR BTL (IV SOLUTION) ×3 IMPLANT
OIL SILICONE PENTAX (PARTS (SERVICE/REPAIRS)) ×3 IMPLANT
PAD ARMBOARD 7.5X6 YLW CONV (MISCELLANEOUS) ×6 IMPLANT
SYR 20ML ECCENTRIC (SYRINGE) ×3 IMPLANT
SYR 5ML LL (SYRINGE) ×1 IMPLANT
SYR 5ML LUER SLIP (SYRINGE) ×1 IMPLANT
SYR CONTROL 10ML LL (SYRINGE) IMPLANT
TOWEL OR 17X24 6PK STRL BLUE (TOWEL DISPOSABLE) ×3 IMPLANT
TRAP SPECIMEN MUCOUS 40CC (MISCELLANEOUS) ×3 IMPLANT
TUBE CONNECTING 20'X1/4 (TUBING) ×1
TUBE CONNECTING 20X1/4 (TUBING) ×2 IMPLANT
VALVE DISPOSABLE (MISCELLANEOUS) ×3 IMPLANT

## 2018-09-21 NOTE — Transfer of Care (Signed)
Immediate Anesthesia Transfer of Care Note  Patient: Susan Elliott  Procedure(s) Performed: VIDEO BRONCHOSCOPY WITH FORCEPS BIOPSY, right middle lung lobe (N/A Chest)  Patient Location: PACU  Anesthesia Type:General  Level of Consciousness: awake and patient cooperative  Airway & Oxygen Therapy: Patient Spontanous Breathing  Post-op Assessment: Report given to RN and Post -op Vital signs reviewed and stable  Post vital signs: Reviewed and stable  Last Vitals:  Vitals Value Taken Time  BP 138/96 09/21/2018 10:43 AM  Temp    Pulse 121 09/21/2018 10:43 AM  Resp    SpO2 97 % 09/21/2018 10:43 AM  Vitals shown include unvalidated device data.  Last Pain: There were no vitals filed for this visit.       Complications: No apparent anesthesia complications

## 2018-09-21 NOTE — Anesthesia Preprocedure Evaluation (Addendum)
Anesthesia Evaluation  Patient identified by MRN, date of birth, ID band Patient awake    Reviewed: Allergy & Precautions, NPO status , Patient's Chart, lab work & pertinent test results  Airway Mallampati: II  TM Distance: >3 FB Neck ROM: Full    Dental  (+) Dental Advisory Given   Pulmonary shortness of breath, asthma ,  Lung mass   breath sounds clear to auscultation       Cardiovascular negative cardio ROS   Rhythm:Regular Rate:Normal     Neuro/Psych  Headaches,    GI/Hepatic negative GI ROS, Neg liver ROS,   Endo/Other  negative endocrine ROS  Renal/GU negative Renal ROS     Musculoskeletal   Abdominal   Peds  Hematology negative hematology ROS (+)   Anesthesia Other Findings   Reproductive/Obstetrics                            Lab Results  Component Value Date   WBC 10.2 08/17/2018   HGB 13.7 08/17/2018   HCT 40.5 08/17/2018   MCV 84.3 08/17/2018   PLT 347.0 08/17/2018   No results found for: CREATININE, BUN, NA, K, CL, CO2  Anesthesia Physical Anesthesia Plan  ASA: II  Anesthesia Plan: General   Post-op Pain Management:    Induction: Intravenous  PONV Risk Score and Plan: 3 and Dexamethasone, Ondansetron, Treatment may vary due to age or medical condition and Midazolam  Airway Management Planned: Oral ETT  Additional Equipment:   Intra-op Plan:   Post-operative Plan: Extubation in OR  Informed Consent: I have reviewed the patients History and Physical, chart, labs and discussed the procedure including the risks, benefits and alternatives for the proposed anesthesia with the patient or authorized representative who has indicated his/her understanding and acceptance.     Dental advisory given  Plan Discussed with: CRNA  Anesthesia Plan Comments:         Anesthesia Quick Evaluation

## 2018-09-21 NOTE — Op Note (Addendum)
Video Bronchoscopy with Endobronchial Ultrasound Procedure Note  Date of Operation: 09/21/2018  Pre-op Diagnosis: Endobronchial mass  Post-op Diagnosis: Endobronchial mass  Surgeon: Rodman Pickle, MD  Assistants:  Gae Gallop, RN Gladstone Lighter, RN Amy Rulon Abide, CST  Anesthesia: General endotracheal anesthesia  Operation: Flexible video fiberoptic bronchoscopy with sampling via forcep biopsy  Estimated Blood Loss: less than 27XA  Complications: None  Indications and History: Susan Elliott is a 23 y.o. female with celiac disease.  The risks, benefits, complications, treatment options and expected outcomes were discussed with the patient.  The possibilities of pneumothorax, pneumonia, reaction to medication, pulmonary aspiration, perforation of a viscus, bleeding, failure to diagnose a condition and creating a complication requiring transfusion or operation were discussed with the patient who freely signed the consent.    Description of Procedure: The patient was examined in the preoperative area and history and data from the preprocedure consultation were reviewed. It was deemed appropriate to proceed.  The patient was taken to OR 10, identified as Susan Elliott and the procedure verified as Flexible Video Fiberoptic Bronchoscopy.  A Time Out was held and the above information confirmed. After being taken to the operating room general anesthesia was initiated and the patient  was orally intubated. The video fiberoptic bronchoscope was introduced via the endotracheal tube and a general inspection was performed which showed normal anatomy and mucosa except for RML occlusion of endobronchial mass. Airway was occluded 90% with inability to pass bronchscope to view RML segments. Lesion was very friable. Forcep biopsy x 6 was obtained.  Epi 1:200,000 was administed to control bleeding; total 0.03 mg Epi. The patient tolerated the procedure well without apparent complications. There was  minimal blood loss. The bronchoscope was withdrawn. Anesthesia was reversed and the patient was taken to the PACU for recovery.   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 We will review the cytology, pathology results with the patient when they become available. Outpatient followup will be with me Loanne Drilling) on 10/03/18 at 2:45 PM.    Aryan Bello Rodman Pickle, MD [facility] 09/21/2018 10:49 AM   RUL  Endobronchial lesion occluding RML:

## 2018-09-21 NOTE — Anesthesia Postprocedure Evaluation (Signed)
Anesthesia Post Note  Patient: Susan Elliott  Procedure(s) Performed: VIDEO BRONCHOSCOPY WITH FORCEPS BIOPSY, right middle lung lobe (N/A Chest)     Patient location during evaluation: PACU Anesthesia Type: General Level of consciousness: awake and alert Pain management: pain level controlled Vital Signs Assessment: post-procedure vital signs reviewed and stable Respiratory status: spontaneous breathing, nonlabored ventilation, respiratory function stable and patient connected to nasal cannula oxygen Cardiovascular status: blood pressure returned to baseline and stable Postop Assessment: no apparent nausea or vomiting Anesthetic complications: no    Last Vitals:  Vitals:   09/21/18 1110 09/21/18 1115  BP: (!) 150/91 (!) 150/91  Pulse: (!) 111 (!) 108  Resp: 16 17  Temp:    SpO2: 97% 100%    Last Pain:  Vitals:   09/21/18 1100  PainSc: 0-No pain                 Tiajuana Amass

## 2018-09-21 NOTE — Anesthesia Procedure Notes (Signed)
Procedure Name: Intubation Date/Time: 09/21/2018 9:52 AM Performed by: Lance Coon, CRNA Pre-anesthesia Checklist: Patient identified, Emergency Drugs available, Suction available, Patient being monitored and Timeout performed Patient Re-evaluated:Patient Re-evaluated prior to induction Oxygen Delivery Method: Circle system utilized Preoxygenation: Pre-oxygenation with 100% oxygen Induction Type: IV induction Ventilation: Mask ventilation without difficulty Laryngoscope Size: Miller and 3 Grade View: Grade II Tube type: Oral Tube size: 8.5 mm Number of attempts: 1 Airway Equipment and Method: Stylet Placement Confirmation: ETT inserted through vocal cords under direct vision,  positive ETCO2 and breath sounds checked- equal and bilateral Secured at: 21 cm Tube secured with: Tape Dental Injury: Teeth and Oropharynx as per pre-operative assessment

## 2018-09-21 NOTE — H&P (Signed)
NAME:  Susan Elliott, MRN:  [id number], DOB:  1995-12-26, LOS: 0 ADMISSION DATE:  09/21/2018, CONSULTATION DATE:  09/21/18 REFERRING MD: Susan Elliott CHIEF COMPLAINT:  Endobronchial mass  History of present illness   [name] is a 23 year old female with celiac disease who is admitted for diagnostic bronchoscopy under general anesthesia for endobronchial mass.  No anticoagulation.  Reports cough and shortness of breath. Denies fevers, chills, chest pain.  Past Medical History  Celiac disease Migraine S/p cholecystectomy  Objective   Blood pressure (!) 153/84, pulse 99, temperature 98.6 F (37 C), resp. rate 18, last menstrual period 09/07/2018, SpO2 95 %.       No intake or output data in the 24 hours ending 09/21/18 0845 There were no vitals filed for this visit.  Physical Exam: General: Well-appearing, no acute distress HENT: [place], AT, OP clear, MMM Eyes: EOMI, no scleral icterus Respiratory: Clear to auscultation bilaterally.  No crackles, wheezing or rales Cardiovascular: RRR, -M/Elliott/G, no JVD GI: BS+, soft, nontender Extremities:-Edema,-tenderness Neuro: AAO x4, CNII-XII grossly intact Skin: Intact, no rashes or bruising Psych: Normal mood, normal affect     Assessment & Plan:  Endobronchial mass Right middle and lower lobe collapse Celiac disease  Plan for diagnostic bronchoscopy with potential sampling via forcep biopsy/needle aspiration/washings Patient consented. Mother at bedside in pre-op.  Labs/Imaging    CT Chest 09/18/18 1. 1.8 cm oval soft tissue structure within the RIGHT bronchus intermedius suspicious for an endobronchial mass. Associated atelectasis/collapse of the RIGHT lower and middle lobes. Direct inspection/bronchoscopy is recommended. 2. Trace RIGHT pleural effusion which may be reactive. 3. No abnormal appearing lymph nodes.  Review of Systems:   Review of Systems  Constitutional: Negative for chills, diaphoresis,  fever, malaise/fatigue and weight loss.  HENT: Negative for congestion and sore throat.   Respiratory: Positive for cough and shortness of breath. Negative for hemoptysis, sputum production and wheezing.   Cardiovascular: Negative for chest pain, orthopnea, leg swelling and PND.  Gastrointestinal: Negative for abdominal pain, heartburn and nausea.  Genitourinary: Negative for frequency.  Musculoskeletal: Negative for myalgias.  Skin: Negative for rash.  Neurological: Negative for dizziness, weakness and headaches.  Endo/Heme/Allergies: Does not bruise/bleed easily.     Past Medical History  She,  has a past medical history of Asthma, Celiac disease (2011), Dyspnea, Migraine, Pneumonia (2019 aug), and Pneumonia due to Streptococcus pneumoniae ([place]) (08/09/2018).   Surgical History    Past Surgical History:  Procedure Laterality Date  . CHOLECYSTECTOMY       Social History   reports that she has never smoked. She has never used smokeless tobacco. She reports current alcohol use. She reports that she does not use drugs.   Family History   Her family history includes Breast cancer in her paternal aunt; Cancer in her maternal grandfather.   Allergies Allergies  Allergen Reactions  . Penicillins Rash and Other (See Comments)    Did it involve swelling of the face/tongue/throat, SOB, or low BP? No Did it involve sudden or severe rash/hives, skin peeling, or any reaction on the inside of your mouth or nose? #  #  #  YES  #  #  #  Did you need to seek medical attention at a hospital or doctor's office? #  #  #  YES  #  #  #  When did it last happen?Childhood allergy   . Gluten Meal Other (See Comments)    Celiac disease  Home Medications  Prior to Admission medications   Medication Sig Start Date End Date Taking? Authorizing Provider  aspirin-acetaminophen-caffeine (EXCEDRIN MIGRAINE) [phone] MG tablet Take 1 tablet by mouth every 6 (six) hours as needed for migraine.    Yes [provider]  cetirizine (ZYRTEC) 10 MG tablet Take 10 mg by mouth daily.   Yes [provider]  fluticasone (FLONASE) 50 MCG/ACT nasal spray Place 1 spray into both nostrils 3 (three) times a week. In the morning.   Yes [provider]  fluticasone (FLOVENT DISKUS) 50 MCG/BLIST diskus inhaler Inhale 1 puff into the lungs 2 (two) times daily. 08/17/18  Yes Susan Ehrich, NP  Multiple Vitamin (MULTIVITAMIN WITH MINERALS) TABS tablet Take 1 tablet by mouth daily.   Yes [provider]  naproxen (NAPROSYN) 500 MG tablet Take 1 tablet (500 mg total) by mouth as directed. Take 1 tab twice daily for 2 weeks, beginning one week prior to first day of menstrual period.  Also, may take every 8 hours as needed. 08/16/18  Yes Elliott, Susan R, DO  Norethin Ace-Eth Estrad-FE 1-20 MG-MCG(24) CHEW CHEW 1 TABLET DAILY Patient taking differently: Chew 1 tablet by mouth every evening. (1900) 08/02/18  Yes Susan Bruins, MD     Susan Adduci Rodman Pickle, MD [facility] 09/21/2018 9:43 AM  Personal pager: [phone] If unanswered, please page CCM On-call: [phone]

## 2018-09-22 ENCOUNTER — Encounter (HOSPITAL_COMMUNITY): Payer: Self-pay | Admitting: Pulmonary Disease

## 2018-09-26 ENCOUNTER — Telehealth: Payer: Self-pay | Admitting: Pulmonary Disease

## 2018-09-26 DIAGNOSIS — R918 Other nonspecific abnormal finding of lung field: Secondary | ICD-10-CM

## 2018-09-26 NOTE — Telephone Encounter (Signed)
I have called patient regarding bronchoscopy results. No definitive malignancy was identified however informed patient that I will discuss her case at tumor board on 09/27/18 and will update her on further plan.  Rodman Pickle, M.D. [facility] Pager: [phone] After hours pager: [phone]

## 2018-09-26 NOTE — Telephone Encounter (Signed)
Call made to patient, patient states she was told she would have her results in two days and wants her results as she was told she would have. I informed the patient that I did not see the results in her chart but I would JE know she was looking for the results.  JE please advise of bronch results. Thanks.

## 2018-09-27 MED ORDER — HYDROCODONE-HOMATROPINE 5-1.5 MG/5ML PO SYRP: 5.0000 mL | ORAL_SOLUTION | Freq: Four times a day (QID) | ORAL | 0 refills | Status: DC | PRN

## 2018-09-27 NOTE — Telephone Encounter (Signed)
After discussing patient case at tumor board, I called and updated Susan Elliott on the plan.  Diagnosis:  Central airway obstruction Endobronchial mass Suspected carcinoid  - PET DOTATATE ordered - Referral to Thoracic Surgery (Dr. Roxan Hockey)

## 2018-09-27 NOTE — Telephone Encounter (Signed)
Langley Gauss,  Please cancel 10/03/18 appointment with me and re-schedule her for follow-up with me in one month.  JE

## 2018-09-27 NOTE — Telephone Encounter (Signed)
Contacted patient by phone.  Rescheduled appointment per Dr. Cordelia Pen instructions as follows: Please cancel 10/03/18 appointment with me and re-schedule her for follow-up with me in one month.  New appt is Feb 27 at 9:45 am.  Patient has FMLA paperwork and will take to [facility]. States she needs FMLA beginning 09/24/2018 due to elevated heart rate at work and unable to work at this time. She has not received new appointment with referral MD.  Nothing further needed at this time.

## 2018-10-01 NOTE — Telephone Encounter (Signed)
Rec'd FMLA forms via interoffice mail from [employer]. It was address to Susan Barrow, NP for completion. [name] is out sick today -placed on her desk to complete once she returns to the office -pr

## 2018-10-02 ENCOUNTER — Telehealth: Payer: Self-pay | Admitting: Pulmonary Disease

## 2018-10-02 MED ORDER — HYDROCODONE-HOMATROPINE 5-1.5 MG/5ML PO SYRP: 5.0000 mL | ORAL_SOLUTION | Freq: Four times a day (QID) | ORAL | 0 refills | Status: DC | PRN

## 2018-10-02 MED ORDER — BENZONATATE 200 MG PO CAPS: 200.0000 mg | ORAL_CAPSULE | Freq: Three times a day (TID) | ORAL | 1 refills | Status: DC | PRN

## 2018-10-02 NOTE — Telephone Encounter (Signed)
Dr. Loanne Drilling is in the office tomorrow, please forward this to her. Will order Gaylon prescription cough medication and tessalon perles to be used to suppress cough. If coughing up a moderate-large amount of frank red blood please advise ED. No NSAIDS or aspirin productus.

## 2018-10-02 NOTE — Telephone Encounter (Signed)
Patient returned call, CB is [phone].  Patient will stay by phone.

## 2018-10-02 NOTE — Telephone Encounter (Signed)
Called and spoke with patient.  She stated that she had a bronchoscopy 09/21/18, by Dr Loanne Drilling.  She stated that over the weekend and continuing today, 10/02/18, when she coughs, her mucus/phlegm is mixed with blood.  She stated that she was told after the procedure, blood in her phlegm was common, but she is wanting to know if she should be concerned, because it is still happening.   Will route to Di Kindle, NP, to advise

## 2018-10-02 NOTE — Telephone Encounter (Signed)
Rec'd completed forms - fwd to [employer] via interoffice mail -pr  °

## 2018-10-02 NOTE — Telephone Encounter (Signed)
Called and spoke with patient, she is aware of response below from [place]. I will also forward this over to Dr. Loanne Drilling. Per patient no one told her not to take NSAIDS or aspirin products. Patient has been taking naproxen. Per patient she is not coughing up a lot of blood it is just blood tinged mucus. Beth please advise if there are any further recommendations until JE comes back. Thank you.

## 2018-10-02 NOTE — Telephone Encounter (Signed)
lmtcb x1 pt 

## 2018-10-02 NOTE — Telephone Encounter (Signed)
Yes. This is common after a bronchoscopy, but if she is concerned please make appointment for patient to be seen to make sure everything is ok.

## 2018-10-02 NOTE — Telephone Encounter (Signed)
Nope, thank you.

## 2018-10-03 ENCOUNTER — Telehealth: Payer: Self-pay | Admitting: Pulmonary Disease

## 2018-10-03 ENCOUNTER — Ambulatory Visit: Payer: 59 | Admitting: Pulmonary Disease

## 2018-10-03 NOTE — Telephone Encounter (Signed)
I called Susan Elliott and she states Dr. Loanne Drilling sent her a message requesting that Dr. Roxan Hockey see the patient sooner, within the next week. The original PET scan was scheduled for the 10/16/2018. PCC's can we try to move this appt up sooner. The pt states she would go anywhere at this point. Beth said [facility] could be another option.

## 2018-10-03 NOTE — Telephone Encounter (Signed)
I left a message on Susan Elliott's VM to call me back ASAP tomorrow AM as she has left for the day.  There is not answer at [place] for [facility].  I will continue to reach out to see what I can get workied for the patient.

## 2018-10-03 NOTE — Telephone Encounter (Addendum)
I have called patient and she states that her hemoptysis yesterday was more like clotted and today it is improved. I advised patient to present to ED if symptoms worsen including recurrent bleeding and tachycardia as she mentioned before. I informed her that I have contacted Dr. Roxan Hockey in [place] who will message staff for sooner appointment if available. I also informed Susan Elliott that we will see if her PET scan can be scheduled sooner as well. Patient expressed understanding.

## 2018-10-04 ENCOUNTER — Other Ambulatory Visit: Payer: Self-pay | Admitting: Pulmonary Disease

## 2018-10-04 DIAGNOSIS — R911 Solitary pulmonary nodule: Secondary | ICD-10-CM

## 2018-10-04 NOTE — Telephone Encounter (Signed)
Yes.  Pt scheduled at [facility] for 2/11 @ 3:00 with check in at 2:30.  Heather told me she would contact pt with the information.

## 2018-10-04 NOTE — Telephone Encounter (Signed)
Per Lovena Le w/Nuc Med w/ [facility], they do not have anything sooner that the 10/16/18 appt that has been scheduled for the patient.  If pt does't take this appt, they would have to r/s to 2/21.  I am trying [facility] & [facility].

## 2018-10-04 NOTE — Telephone Encounter (Signed)
Susan Elliott, is there any update on this? Were you able to get sooner appt at [facility] or [facility]?

## 2018-10-04 NOTE — Progress Notes (Signed)
[  10/04/2018 9:15 AM]  Vella Kohler:   Got her schedule for 2/11 @ 3:00, pt needs to arrive by 2:30 pm for check in & they will call pt the day before to go over everything.   [10/04/2018 9:16 AM]  Vella Kohler:   [address], [place], [place] [address].   Patient has been scheduled and aware nothing further needed at this time.

## 2018-10-08 NOTE — Telephone Encounter (Signed)
Called and spoke with Jenny Reichmann, Dr Hendrickson's office.  She is aware of PET appt. And she stated that the Patient has appt with Dr Leonarda Salon 10/10/18.  Nothing further at this time.

## 2018-10-09 ENCOUNTER — Encounter
Admission: RE | Admit: 2018-10-09 | Discharge: 2018-10-09 | Disposition: A | Discharge status: Home / Self Care | Payer: 59 | Source: Ambulatory Visit | Attending: Pulmonary Disease | Admitting: Pulmonary Disease

## 2018-10-09 DIAGNOSIS — R911 Solitary pulmonary nodule: Secondary | ICD-10-CM | POA: Insufficient documentation

## 2018-10-09 MED ORDER — GALLIUM GA 68 DOTATATE IV KIT
5.8100 | PACK | Freq: Once | INTRAVENOUS | Status: AC
  Administered 2018-10-09: 5.81 via INTRAVENOUS

## 2018-10-10 ENCOUNTER — Encounter: Payer: Self-pay | Admitting: *Deleted

## 2018-10-10 ENCOUNTER — Encounter: Payer: Self-pay | Admitting: Thoracic Surgery (Cardiothoracic Vascular Surgery)

## 2018-10-10 ENCOUNTER — Other Ambulatory Visit: Payer: Self-pay | Admitting: *Deleted

## 2018-10-10 ENCOUNTER — Other Ambulatory Visit: Payer: Self-pay

## 2018-10-10 ENCOUNTER — Institutional Professional Consult (permissible substitution) (INDEPENDENT_AMBULATORY_CARE_PROVIDER_SITE_OTHER): Payer: 59 | Admitting: Thoracic Surgery (Cardiothoracic Vascular Surgery)

## 2018-10-10 VITALS — BP 128/88 | HR 115 | Resp 18 | Ht 67.0 in | Wt 233.6 lb

## 2018-10-10 DIAGNOSIS — R918 Other nonspecific abnormal finding of lung field: Secondary | ICD-10-CM

## 2018-10-10 DIAGNOSIS — D491 Neoplasm of unspecified behavior of respiratory system: Secondary | ICD-10-CM

## 2018-10-10 NOTE — H&P (View-Only) (Signed)
PCP is [name], PA-C Referring Provider is Margaretha Seeds, MD  Chief Complaint  Patient presents with  . Mass    New patient consultation, endobronchial mass Chest Ct 09/18/2018, PFTs 08/16/2018, Bronch 09/21/2018, PET 10/09/2018    HPI: Susan Elliott is sent for consultation regarding an endobronchial mass  Susan Elliott is a 23 year old woman with a history of celiac disease, migraines, and asthma.  She first started having asthma a couple of years ago.  In late July early August 2019 she developed a persistent cough.  She went to the emergency room with a migraine headache.  The chest x-ray showed a pneumonia in the right lung.  She was treated with 3 separate rounds of antibiotics and steroids.  Her symptoms improved each time but rapidly recurred.  She was eventually referred to Dr. Loanne Drilling.  PFT showed a mild obstructive airway defect.  A follow-up chest x-ray in January 2020 showed persistent mild elevation of the right diaphragm and persistent right lung base opacity.  A sniff test and CT were ordered.  The CT showed a endobronchial mass lesion in the bronchus intermedius with associated middle and lower lobe atelectasis.  Sniff test showed the diaphragm did move normally.  A bronchoscopy was done on 09/21/2018.  It showed an endobronchial mass lesion.  Biopsies were nondiagnostic. Past Medical History:  Diagnosis Date  . Asthma   . Celiac disease 2011  . Dyspnea     1/ 23/2020- just since Ive had pneumonia  . Migraine   . Pneumonia 2019 aug  . Pneumonia due to Streptococcus pneumoniae ([place]) 08/09/2018    Past Surgical History:  Procedure Laterality Date  . CHOLECYSTECTOMY    . VIDEO BRONCHOSCOPY N/A 09/21/2018   Procedure: VIDEO BRONCHOSCOPY WITH FORCEPS BIOPSY, right middle lung lobe;  Surgeon: Margaretha Seeds, MD;  Location: [facility] OR;  Service: Thoracic;  Laterality: N/A;    Family History  Problem Relation Age of Onset  . Breast cancer Paternal Aunt   . Cancer  Maternal Grandfather        lung    Social History Social History   Tobacco Use  . Smoking status: Never Smoker  . Smokeless tobacco: Never Used  Substance Use Topics  . Alcohol use: Yes    Comment: OCC  . Drug use: Never    Current Outpatient Medications  Medication Sig Dispense Refill  . aspirin-acetaminophen-caffeine (EXCEDRIN MIGRAINE) 250-250-65 MG tablet Take 1 tablet by mouth every 6 (six) hours as needed for migraine.    . benzonatate (TESSALON) 200 MG capsule Take 1 capsule (200 mg total) by mouth 3 (three) times daily as needed for cough. 45 capsule 1  . cetirizine (ZYRTEC) 10 MG tablet Take 10 mg by mouth daily.    . fluticasone (FLONASE) 50 MCG/ACT nasal spray Place 1 spray into both nostrils 3 (three) times a week. In the morning.    Marland Kitchen HYDROcodone-homatropine (HYCODAN) 5-1.5 MG/5ML syrup Take 5 mLs by mouth every 6 (six) hours as needed for cough. 240 mL 0  . Multiple Vitamin (MULTIVITAMIN WITH MINERALS) TABS tablet Take 1 tablet by mouth daily.    . naproxen (NAPROSYN) 500 MG tablet Take 1 tablet (500 mg total) by mouth as directed. Take 1 tab twice daily for 2 weeks, beginning one week prior to first day of menstrual period.  Also, may take every 8 hours as needed. 40 tablet 5  . Norethin Ace-Eth Estrad-FE 1-20 MG-MCG(24) CHEW CHEW 1 TABLET DAILY (Patient taking differently: Chew 1  tablet by mouth every evening. (1900)) 84 tablet 4   No current facility-administered medications for this visit.     Allergies  Allergen Reactions  . Penicillins Rash and Other (See Comments)    Did it involve swelling of the face/tongue/throat, SOB, or low BP? No Did it involve sudden or severe rash/hives, skin peeling, or any reaction on the inside of your mouth or nose? #  #  #  YES  #  #  #  Did you need to seek medical attention at a hospital or doctor's office? #  #  #  YES  #  #  #  When did it last happen?Childhood allergy   . Gluten Meal Other (See Comments)    Celiac  disease    Review of Systems  Constitutional: Negative for activity change, appetite change, fever and unexpected weight change.  HENT: Negative for trouble swallowing and voice change.   Eyes: Negative for visual disturbance.  Respiratory: Positive for cough, chest tightness and wheezing.   Cardiovascular: Negative for chest pain and leg swelling.  Gastrointestinal: Positive for abdominal pain. Negative for abdominal distention.  Genitourinary: Negative for difficulty urinating and dysuria.  Musculoskeletal: Negative for arthralgias and myalgias.  Neurological: Positive for headaches (Migraines). Negative for seizures and weakness.  Hematological: Negative for adenopathy. Does not bruise/bleed easily.  All other systems reviewed and are negative.   BP 128/88 (BP Location: Right Arm, Patient Position: Sitting, Cuff Size: Large) Comment: manual  Pulse (!) 115   Resp 18   Ht 5' 7"  (1.702 m)   Wt 233 lb 9.6 oz (106 kg)   SpO2 97% Comment: RA  BMI 36.59 kg/m  Physical Exam Vitals signs reviewed.  Constitutional:      General: She is not in acute distress. HENT:     Head: Normocephalic and atraumatic.     Mouth/Throat:     Pharynx: Oropharynx is clear.  Eyes:     General: No scleral icterus.    Extraocular Movements: Extraocular movements intact.     Pupils: Pupils are equal, round, and reactive to light.  Cardiovascular:     Rate and Rhythm: Normal rate and regular rhythm.     Heart sounds: Normal heart sounds. No murmur. No friction rub. No gallop.   Pulmonary:     Effort: Pulmonary effort is normal. No respiratory distress.     Breath sounds: No wheezing or rales.     Comments: Diminished breath sounds right base, otherwise clear Abdominal:     General: There is no distension.     Palpations: Abdomen is soft.     Tenderness: There is no abdominal tenderness.  Musculoskeletal: Normal range of motion.        General: No swelling.  Lymphadenopathy:     Cervical: No  cervical adenopathy.  Skin:    General: Skin is warm.     Capillary Refill: Capillary refill takes less than 2 seconds.  Neurological:     General: No focal deficit present.     Mental Status: She is alert.     Cranial Nerves: No cranial nerve deficit.     Motor: No weakness.     Gait: Gait normal.    Diagnostic Tests: CT CHEST WITHOUT CONTRAST  TECHNIQUE: Multidetector CT imaging of the chest was performed following the standard protocol without IV contrast.  COMPARISON:  None.  FINDINGS: Cardiovascular: No significant vascular findings. Normal heart size. No pericardial effusion.  Mediastinum/Nodes: No enlarged mediastinal or axillary lymph nodes.  Thyroid gland, trachea, and esophagus demonstrate no significant findings.  Lungs/Pleura: Collapse of a large portion of the RIGHT lower lobe and atelectasis/collapse of the RIGHT middle lobe noted. A 1.8 cm oval soft tissue structure within the RIGHT bronchus intermedius is suspicious for an endobronchial mass (series 2: Images 66-72).  The lungs are otherwise clear. No other mass or endobronchial abnormality identified. A trace RIGHT pleural effusion is noted. There is no evidence of pneumothorax.  Upper Abdomen: Unremarkable except for evidence of cholecystectomy.  Musculoskeletal: No acute or suspicious bony abnormalities identified.  IMPRESSION: 1. 1.8 cm oval soft tissue structure within the RIGHT bronchus intermedius suspicious for an endobronchial mass. Associated atelectasis/collapse of the RIGHT lower and middle lobes. Direct inspection/bronchoscopy is recommended. 2. Trace RIGHT pleural effusion which may be reactive. 3. No abnormal appearing lymph nodes.  These results will be called to the ordering clinician or representative by the Radiologist Assistant, and communication documented in the PACS or zVision Dashboard.   Electronically Signed   By: Margarette Canada M.D.   On: 09/18/2018  15:05 NUCLEAR MEDICINE PET SKULL BASE TO THIGH  TECHNIQUE: 5.8 mCi Ga 68 DOTATATE was injected intravenously. Full-ring PET imaging was performed from the skull base to thigh after the radiotracer. CT data was obtained and used for attenuation correction and anatomic localization.  COMPARISON:  Chest CT 09/18/2010  FINDINGS: NECK  No radiotracer activity in neck lymph nodes.  Incidental CT findings: None  CHEST  At the level of the RIGHT bronchus intermedius, there is a focus of intense radiotracer activity SUV max equal 71. Discrete lesion is difficult define. There is constriction of the bronchus at this level (image 200 of the fused data set). Comparison to CT 09/18/2018 demonstrates a endobronchial lesion at this level measuring 18 mm.  No additional foci of abnormal radiotracer accumulation within the mediastinum. No foci of in pulmonary nodule activity.  There is postobstructive collapse of the RIGHT lower lobe which is increased from comparison CT.  Incidental CT finding:None  ABDOMEN/PELVIS  No abnormal radiotracer accumulation in the liver. No abnormal radiotracer accumulation within the large or small bowel. No enlarged lymph nodes in the abdomen pelvis. No abnormal metabolic activity along the lymph node stations.  Physiologic activity noted in the liver, spleen, adrenal glands and kidneys.  Incidental CT findings:Uterus and ovaries normal.  SKELETON  No focal activity to suggest skeletal metastasis.  Incidental CT findings:None  IMPRESSION: 1. Intense radiotracer activity associated with the endobronchial lesion in the RIGHT bronchus intermedius consistent with well differentiated neuroendocrine tumor. 2. No evidence metastatic neuroendocrine tumor on skull base to thigh DOTATATE PET scan. 3. Post obstructive collapse of the RIGHT lower lobe increased from comparison exam.   Electronically Signed   By: Suzy Bouchard  M.D.   On: 10/10/2018 08:20 I personally reviewed the CT and dotatate PET scans and concur with the findings noted above  Pulmonary function testing 08/16/2018 FVC 3.72 (89%) FEV1 3.13 (87%) DLCO 21.46 (75%)  Impression: Susan Elliott is a 23 year old young woman who has a history of celiac disease and migraine headaches.  She also has a diagnosis of asthma.  She is a non-smoker.  She developed a persistent cough about 6 months ago.  Chest x-ray showed an opacity at the right base.  She was treated with multiple courses of antibiotics and steroids.  Each time she would have some clinical improvement but then her symptoms would return relatively rapidly.  A CT of the chest showed that she  had a mass lesion in the bronchus intermedius with significant middle and lower lobe atelectasis.  There was no adenopathy.  Biopsy was indeterminate.  A PET dotatate scan shows high level uptake consistent with a carcinoid tumor.  Other tumors that might be in the differential would be bronchial adenoma or adenoid cystic carcinoma, but based on the dotatate results carcinoid tumors for more likely.  I had a long discussion with Susan Elliott and her mother.  We reviewed the images we talked about the natural history of carcinoid tumors.  We discussed the need for surgical intervention.  Based on the pictures from bronchoscopy I do not think this will be something that can be managed endobronchially.  However, I do want to take a look at it with the bronchoscope myself prior to surgical intervention.  Based on the CT and PET dotatate images I suspect we will need to do a bilobectomy.  There is a small possibility some type of sleeve reconstruction will be possible, and we will assess for that possibility intraoperatively.  I recommended that we proceed with bronchoscopy, right VATS, possible sleeve resection, possible bilobectomy.  I discussed the general nature of the procedure and the intraoperative decision making  with Susan Elliott and her mother.  They understand the need for general anesthesia, the incision to be used, the use of a drainage tube postoperatively, the expected hospital stay, and the overall recovery.  I informed him of the indications, risk, benefits, and alternatives.  They understand the risks include, but not limited to DVT, PE, bleeding, possible need for transfusion, infection, prolonged air leak, irregular heart rhythms, as well as possibility of other unstable complications.  She accepts the risks and wishes to proceed.  Plan:  Bronchoscopy, right VATS, possible sleeve resection, possible bilobectomy on Wednesday, 10/17/2018  Melrose Nakayama, MD [facility] [phone]

## 2018-10-10 NOTE — Progress Notes (Signed)
PCP is [name], PA-C Referring Provider is Susan Seeds, MD  Chief Complaint  Patient presents with  . Mass    New patient consultation, endobronchial mass Chest Ct 09/18/2018, PFTs 08/16/2018, Bronch 09/21/2018, PET 10/09/2018    HPI: [facility] is sent for consultation regarding an endobronchial mass  Susan Elliott is a 23 year old woman with a history of celiac disease, migraines, and asthma.  She first started having asthma a couple of years ago.  In late July early August 2019 she developed a persistent cough.  She went to the emergency room with a migraine headache.  The chest x-ray showed a pneumonia in the right lung.  She was treated with 3 separate rounds of antibiotics and steroids.  Susan symptoms improved each time but rapidly recurred.  She was eventually referred to Dr. Loanne Drilling.  PFT showed a mild obstructive airway defect.  A follow-up chest x-ray in January 2020 showed persistent mild elevation of the right diaphragm and persistent right lung base opacity.  A sniff test and CT were ordered.  The CT showed a endobronchial mass lesion in the bronchus intermedius with associated middle and lower lobe atelectasis.  Sniff test showed the diaphragm did move normally.  A bronchoscopy was done on 09/21/2018.  It showed an endobronchial mass lesion.  Biopsies were nondiagnostic. Past Medical History:  Diagnosis Date  . Asthma   . Celiac disease 2011  . Dyspnea     1/ 23/2020- just since Ive had pneumonia  . Migraine   . Pneumonia 2019 aug  . Pneumonia due to Streptococcus pneumoniae ([facility]) 08/09/2018    Past Surgical History:  Procedure Laterality Date  . CHOLECYSTECTOMY    . VIDEO BRONCHOSCOPY N/A 09/21/2018   Procedure: VIDEO BRONCHOSCOPY WITH FORCEPS BIOPSY, right middle lung lobe;  Surgeon: Susan Seeds, MD;  Location: [facility];  Service: Thoracic;  Laterality: N/A;    Family History  Problem Relation Age of Onset  . Breast cancer Paternal Aunt   . Cancer  Maternal Grandfather        lung    Social History Social History   Tobacco Use  . Smoking status: Never Smoker  . Smokeless tobacco: Never Used  Substance Use Topics  . Alcohol use: Yes    Comment: OCC  . Drug use: Never    Current Outpatient Medications  Medication Sig Dispense Refill  . aspirin-acetaminophen-caffeine (EXCEDRIN MIGRAINE) 250-250-65 MG tablet Take 1 tablet by mouth every 6 (six) hours as needed for migraine.    . benzonatate (TESSALON) 200 MG capsule Take 1 capsule (200 mg total) by mouth 3 (three) times daily as needed for cough. 45 capsule 1  . cetirizine (ZYRTEC) 10 MG tablet Take 10 mg by mouth daily.    . fluticasone (FLONASE) 50 MCG/ACT nasal spray Place 1 spray into both nostrils 3 (three) times a week. In the morning.    Marland Kitchen HYDROcodone-homatropine (HYCODAN) 5-1.5 MG/5ML syrup Take 5 mLs by mouth every 6 (six) hours as needed for cough. 240 mL 0  . Multiple Vitamin (MULTIVITAMIN WITH MINERALS) TABS tablet Take 1 tablet by mouth daily.    . naproxen (NAPROSYN) 500 MG tablet Take 1 tablet (500 mg total) by mouth as directed. Take 1 tab twice daily for 2 weeks, beginning one week prior to first day of menstrual period.  Also, may take every 8 hours as needed. 40 tablet 5  . Norethin Ace-Eth Estrad-FE 1-20 MG-MCG(24) CHEW CHEW 1 TABLET DAILY (Patient taking differently: Chew 1  tablet by mouth every evening. (1900)) 84 tablet 4   No current facility-administered medications for this visit.     Allergies  Allergen Reactions  . Penicillins Rash and Other (See Comments)    Did it involve swelling of the face/tongue/throat, SOB, or low BP? No Did it involve sudden or severe rash/hives, skin peeling, or any reaction on the inside of your mouth or nose? #  #  #  YES  #  #  #  Did you need to seek medical attention at a hospital or doctor's office? #  #  #  YES  #  #  #  When did it last happen?Childhood allergy   . Gluten Meal Other (See Comments)    Celiac  disease    Review of Systems  Constitutional: Negative for activity change, appetite change, fever and unexpected weight change.  HENT: Negative for trouble swallowing and voice change.   Eyes: Negative for visual disturbance.  Respiratory: Positive for cough, chest tightness and wheezing.   Cardiovascular: Negative for chest pain and leg swelling.  Gastrointestinal: Positive for abdominal pain. Negative for abdominal distention.  Genitourinary: Negative for difficulty urinating and dysuria.  Musculoskeletal: Negative for arthralgias and myalgias.  Neurological: Positive for headaches (Migraines). Negative for seizures and weakness.  Hematological: Negative for adenopathy. Does not bruise/bleed easily.  All other systems reviewed and are negative.   BP 128/88 (BP Location: Right Arm, Patient Position: Sitting, Cuff Size: Large) Comment: manual  Pulse (!) 115   Resp 18   Ht 5' 7"  (1.702 m)   Wt 233 lb 9.6 oz (106 kg)   SpO2 97% Comment: RA  BMI 36.59 kg/m  Physical Exam Vitals signs reviewed.  Constitutional:      General: She is not in acute distress. HENT:     Head: Normocephalic and atraumatic.     Mouth/Throat:     Pharynx: Oropharynx is clear.  Eyes:     General: No scleral icterus.    Extraocular Movements: Extraocular movements intact.     Pupils: Pupils are equal, round, and reactive to light.  Cardiovascular:     Rate and Rhythm: Normal rate and regular rhythm.     Heart sounds: Normal heart sounds. No murmur. No friction rub. No gallop.   Pulmonary:     Effort: Pulmonary effort is normal. No respiratory distress.     Breath sounds: No wheezing or rales.     Comments: Diminished breath sounds right base, otherwise clear Abdominal:     General: There is no distension.     Palpations: Abdomen is soft.     Tenderness: There is no abdominal tenderness.  Musculoskeletal: Normal range of motion.        General: No swelling.  Lymphadenopathy:     Cervical: No  cervical adenopathy.  Skin:    General: Skin is warm.     Capillary Refill: Capillary refill takes less than 2 seconds.  Neurological:     General: No focal deficit present.     Mental Status: She is alert.     Cranial Nerves: No cranial nerve deficit.     Motor: No weakness.     Gait: Gait normal.    Diagnostic Tests: CT CHEST WITHOUT CONTRAST  TECHNIQUE: Multidetector CT imaging of the chest was performed following the standard protocol without IV contrast.  COMPARISON:  None.  FINDINGS: Cardiovascular: No significant vascular findings. Normal heart size. No pericardial effusion.  Mediastinum/Nodes: No enlarged mediastinal or axillary lymph nodes.  Thyroid gland, trachea, and esophagus demonstrate no significant findings.  Lungs/Pleura: Collapse of a large portion of the RIGHT lower lobe and atelectasis/collapse of the RIGHT middle lobe noted. A 1.8 cm oval soft tissue structure within the RIGHT bronchus intermedius is suspicious for an endobronchial mass (series 2: Images 66-72).  The lungs are otherwise clear. No other mass or endobronchial abnormality identified. A trace RIGHT pleural effusion is noted. There is no evidence of pneumothorax.  Upper Abdomen: Unremarkable except for evidence of cholecystectomy.  Musculoskeletal: No acute or suspicious bony abnormalities identified.  IMPRESSION: 1. 1.8 cm oval soft tissue structure within the RIGHT bronchus intermedius suspicious for an endobronchial mass. Associated atelectasis/collapse of the RIGHT lower and middle lobes. Direct inspection/bronchoscopy is recommended. 2. Trace RIGHT pleural effusion which may be reactive. 3. No abnormal appearing lymph nodes.  These results will be called to the ordering clinician or representative by the Radiologist Assistant, and communication documented in the PACS or zVision Dashboard.   Electronically Signed   By: Susan Elliott M.D.   On: 09/18/2018  15:05 NUCLEAR MEDICINE PET SKULL BASE TO THIGH  TECHNIQUE: 5.8 mCi Ga 68 DOTATATE was injected intravenously. Full-ring PET imaging was performed from the skull base to thigh after the radiotracer. CT data was obtained and used for attenuation correction and anatomic localization.  COMPARISON:  Chest CT 09/18/2010  FINDINGS: NECK  No radiotracer activity in neck lymph nodes.  Incidental CT findings: None  CHEST  At the level of the RIGHT bronchus intermedius, there is a focus of intense radiotracer activity SUV max equal 71. Discrete lesion is difficult define. There is constriction of the bronchus at this level (image 200 of the fused data set). Comparison to CT 09/18/2018 demonstrates a endobronchial lesion at this level measuring 18 mm.  No additional foci of abnormal radiotracer accumulation within the mediastinum. No foci of in pulmonary nodule activity.  There is postobstructive collapse of the RIGHT lower lobe which is increased from comparison CT.  Incidental CT finding:None  ABDOMEN/PELVIS  No abnormal radiotracer accumulation in the liver. No abnormal radiotracer accumulation within the large or small bowel. No enlarged lymph nodes in the abdomen pelvis. No abnormal metabolic activity along the lymph node stations.  Physiologic activity noted in the liver, spleen, adrenal glands and kidneys.  Incidental CT findings:Uterus and ovaries normal.  SKELETON  No focal activity to suggest skeletal metastasis.  Incidental CT findings:None  IMPRESSION: 1. Intense radiotracer activity associated with the endobronchial lesion in the RIGHT bronchus intermedius consistent with well differentiated neuroendocrine tumor. 2. No evidence metastatic neuroendocrine tumor on skull base to thigh DOTATATE PET scan. 3. Post obstructive collapse of the RIGHT lower lobe increased from comparison exam.   Electronically Signed   By: [facility]  M.D.   On: 10/10/2018 08:20 I personally reviewed the CT and dotatate PET scans and concur with the findings noted above  Pulmonary function testing 08/16/2018 FVC 3.72 (89%) FEV1 3.13 (87%) DLCO 21.46 (75%)  Impression: Susan [facility] is a 23 year old young woman who has a history of celiac disease and migraine headaches.  She also has a diagnosis of asthma.  She is a non-smoker.  She developed a persistent cough about 6 months ago.  Chest x-ray showed an opacity at the right base.  She was treated with multiple courses of antibiotics and steroids.  Each time she would have some clinical improvement but then Susan symptoms would return relatively rapidly.  A CT of the chest showed that she  had a mass lesion in the bronchus intermedius with significant middle and lower lobe atelectasis.  There was no adenopathy.  Biopsy was indeterminate.  A PET dotatate scan shows high level uptake consistent with a carcinoid tumor.  Other tumors that might be in the differential would be bronchial adenoma or adenoid cystic carcinoma, but based on the dotatate results carcinoid tumors for more likely.  I had a long discussion with [facility] and [facility].  We reviewed the images we talked about the natural history of carcinoid tumors.  We discussed the need for surgical intervention.  Based on the pictures from bronchoscopy I do not think this will be something that can be managed endobronchially.  However, I do want to take a look at it with the bronchoscope myself prior to surgical intervention.  Based on the CT and PET dotatate images I suspect we will need to do a bilobectomy.  There is a small possibility some type of sleeve reconstruction will be possible, and we will assess for that possibility intraoperatively.  I recommended that we proceed with bronchoscopy, right VATS, possible sleeve resection, possible bilobectomy.  I discussed the general nature of the procedure and the intraoperative decision making  with [facility] and [facility].  They understand the need for general anesthesia, the incision to be used, the use of a drainage tube postoperatively, the expected hospital stay, and the overall recovery.  I informed him of the indications, risk, benefits, and alternatives.  They understand the risks include, but not limited to DVT, PE, bleeding, possible need for transfusion, infection, prolonged air leak, irregular heart rhythms, as well as possibility of other unstable complications.  She accepts the risks and wishes to proceed.  Plan:  Bronchoscopy, right VATS, possible sleeve resection, possible bilobectomy on Wednesday, 10/17/2018  Melrose Nakayama, MD [facility] [phone]

## 2018-10-15 NOTE — Pre-Procedure Instructions (Signed)
Susan Elliott  10/15/2018      [id number] [address], [place] [address] Phone: [phone] Fax: [phone]    Your procedure is scheduled on February 19th.  Report to [facility] Entrance "A" at 6:30 A.M.  Call this number if you have problems the morning of surgery:  [phone]   Remember:  Do not eat or drink after midnight.     Take these medicines the morning of surgery with A SIP OF WATER   Acetaminophen (Tylenol) - if needed   7 days prior to surgery STOP taking any Aspirin (unless otherwise instructed by your surgeon), Aleve, Naproxen, Ibuprofen, Motrin, Advil, Goody's, BC's, all herbal medications, fish oil, and all vitamins.    Do not wear jewelry, make-up or nail polish.  Do not wear lotions, powders, or perfumes, or deodorant.  Do not shave 48 hours prior to surgery.  Men may shave face and neck.  Do not bring valuables to the hospital.  [facility] is not responsible for any belongings or valuables.   [place]- Preparing For Surgery  Before surgery, you can play an important role. Because skin is not sterile, your skin needs to be as free of germs as possible. You can reduce the number of germs on your skin by washing with CHG (chlorahexidine gluconate) Soap before surgery.  CHG is an antiseptic cleaner which kills germs and bonds with the skin to continue killing germs even after washing.    Oral Hygiene is also important to reduce your risk of infection.  Remember - BRUSH YOUR TEETH THE MORNING OF SURGERY WITH YOUR REGULAR TOOTHPASTE  Please do not use if you have an allergy to CHG or antibacterial soaps. If your skin becomes reddened/irritated stop using the CHG.  Do not shave (including legs and underarms) for at least 48 hours prior to first CHG shower. It is OK to shave your face.  Please follow these instructions carefully.   1. Shower the NIGHT BEFORE SURGERY and the MORNING OF  SURGERY with CHG.   2. If you chose to wash your hair, wash your hair first as usual with your normal shampoo.  3. After you shampoo, rinse your hair and body thoroughly to remove the shampoo.  4. Use CHG as you would any other liquid soap. You can apply CHG directly to the skin and wash gently with a scrungie or a clean washcloth.   5. Apply the CHG Soap to your body ONLY FROM THE NECK DOWN.  Do not use on open wounds or open sores. Avoid contact with your eyes, ears, mouth and genitals (private parts). Wash Face and genitals (private parts)  with your normal soap.  6. Wash thoroughly, paying special attention to the area where your surgery will be performed.  7. Thoroughly rinse your body with warm water from the neck down.  8. DO NOT shower/wash with your normal soap after using and rinsing off the CHG Soap.  9. Pat yourself dry with a CLEAN TOWEL.  10. Wear CLEAN PAJAMAS to bed the night before surgery, wear comfortable clothes the morning of surgery  11. Place CLEAN SHEETS on your bed the night of your first shower and DO NOT SLEEP WITH PETS.   Day of Surgery:  Do not apply any deodorants/lotions.  Please wear clean clothes to the hospital/surgery center.   Remember to brush your teeth WITH YOUR REGULAR TOOTHPASTE.   Contacts, dentures or bridgework may  not be worn into surgery.  Leave your suitcase in the car.  After surgery it may be brought to your room.  For patients admitted to the hospital, discharge time will be determined by your treatment team.  Patients discharged the day of surgery will not be allowed to drive home.

## 2018-10-16 ENCOUNTER — Other Ambulatory Visit: Payer: Self-pay

## 2018-10-16 ENCOUNTER — Encounter (HOSPITAL_COMMUNITY): Payer: Self-pay

## 2018-10-16 ENCOUNTER — Encounter (HOSPITAL_COMMUNITY)
Admission: RE | Admit: 2018-10-16 | Discharge: 2018-10-16 | Disposition: A | Discharge status: Home / Self Care | Payer: 59 | Source: Ambulatory Visit | Attending: Thoracic Surgery (Cardiothoracic Vascular Surgery) | Admitting: Thoracic Surgery (Cardiothoracic Vascular Surgery)

## 2018-10-16 ENCOUNTER — Ambulatory Visit (HOSPITAL_COMMUNITY)
Admission: RE | Admit: 2018-10-16 | Discharge: 2018-10-16 | Disposition: A | Discharge status: Home / Self Care | Payer: 59 | Source: Ambulatory Visit | Attending: Thoracic Surgery (Cardiothoracic Vascular Surgery) | Admitting: Thoracic Surgery (Cardiothoracic Vascular Surgery)

## 2018-10-16 ENCOUNTER — Ambulatory Visit (HOSPITAL_COMMUNITY): Payer: 59

## 2018-10-16 DIAGNOSIS — D491 Neoplasm of unspecified behavior of respiratory system: Secondary | ICD-10-CM

## 2018-10-16 HISTORY — DX: Other complications of anesthesia, initial encounter: T88.59XA

## 2018-10-16 HISTORY — DX: Adverse effect of unspecified anesthetic, initial encounter: T41.45XA

## 2018-10-16 LAB — BLOOD GAS, ARTERIAL
Acid-base deficit: 0.7 mmol/L (ref 0.0–2.0)
Bicarbonate: 22.6 mmol/L (ref 20.0–28.0)
Drawn by: 470591
FIO2: 21
O2 Saturation: 98.4 %
PH ART: 7.465 — AB (ref 7.350–7.450)
Patient temperature: 98.6
pCO2 arterial: 31.9 mmHg — ABNORMAL LOW (ref 32.0–48.0)
pO2, Arterial: 112 mmHg — ABNORMAL HIGH (ref 83.0–108.0)

## 2018-10-16 LAB — CBC
HCT: 38.7 % (ref 36.0–46.0)
Hemoglobin: 12.4 g/dL (ref 12.0–15.0)
MCH: 27.9 pg (ref 26.0–34.0)
MCHC: 32 g/dL (ref 30.0–36.0)
MCV: 87.2 fL (ref 80.0–100.0)
Platelets: 329 10*3/uL (ref 150–400)
RBC: 4.44 MIL/uL (ref 3.87–5.11)
RDW: 13.3 % (ref 11.5–15.5)
WBC: 9.4 10*3/uL (ref 4.0–10.5)
nRBC: 0 % (ref 0.0–0.2)

## 2018-10-16 LAB — COMPREHENSIVE METABOLIC PANEL
ALBUMIN: 3.4 g/dL — AB (ref 3.5–5.0)
ALT: 14 U/L (ref 0–44)
AST: 25 U/L (ref 15–41)
Alkaline Phosphatase: 64 U/L (ref 38–126)
Anion gap: 10 (ref 5–15)
BUN: 7 mg/dL (ref 6–20)
CO2: 21 mmol/L — ABNORMAL LOW (ref 22–32)
Calcium: 9.2 mg/dL (ref 8.9–10.3)
Chloride: 106 mmol/L (ref 98–111)
Creatinine, Ser: 0.74 mg/dL (ref 0.44–1.00)
GFR calc Af Amer: >60 mL/min (ref >60)
GFR calc non Af Amer: >60 mL/min (ref >60)
GLUCOSE: 110 mg/dL — AB (ref 70–99)
Potassium: 4.2 mmol/L (ref 3.5–5.1)
Sodium: 137 mmol/L (ref 135–145)
Total Bilirubin: 0.9 mg/dL (ref 0.3–1.2)
Total Protein: 6.8 g/dL (ref 6.5–8.1)

## 2018-10-16 LAB — URINALYSIS, ROUTINE W REFLEX MICROSCOPIC
Bilirubin Urine: NEGATIVE
Glucose, UA: NEGATIVE mg/dL
Hgb urine dipstick: NEGATIVE
Ketones, ur: NEGATIVE mg/dL
Leukocytes,Ua: NEGATIVE
Nitrite: NEGATIVE
Protein, ur: NEGATIVE mg/dL
Specific Gravity, Urine: 1.008 (ref 1.005–1.030)
pH: 6 (ref 5.0–8.0)

## 2018-10-16 LAB — APTT: aPTT: 34 seconds (ref 24–36)

## 2018-10-16 LAB — SURGICAL PCR SCREEN
MRSA, PCR: NEGATIVE
Staphylococcus aureus: NEGATIVE

## 2018-10-16 LAB — PROTIME-INR
INR: 1
Prothrombin Time: 13.1 seconds (ref 11.4–15.2)

## 2018-10-16 NOTE — Anesthesia Preprocedure Evaluation (Addendum)
Anesthesia Evaluation  Patient identified by MRN, date of birth, ID band Patient awake    Reviewed: Allergy & Precautions, NPO status , Patient's Chart, lab work & pertinent test results  Airway Mallampati: II  TM Distance: >3 FB Neck ROM: Full    Dental no notable dental hx.    Pulmonary asthma ,    Pulmonary exam normal breath sounds clear to auscultation       Cardiovascular negative cardio ROS Normal cardiovascular exam Rhythm:Regular Rate:Normal     Neuro/Psych  Headaches,    GI/Hepatic negative GI ROS, Neg liver ROS,   Endo/Other  negative endocrine ROS  Renal/GU negative Renal ROS     Musculoskeletal negative musculoskeletal ROS (+)   Abdominal (+) + obese,   Peds  Hematology negative hematology ROS (+)   Anesthesia Other Findings ENDOBRONCHIAL TUMOR  Reproductive/Obstetrics                            Anesthesia Physical Anesthesia Plan  ASA: III  Anesthesia Plan: General   Post-op Pain Management:    Induction:   PONV Risk Score and Plan: 3 and Midazolam, Dexamethasone, Ondansetron and Treatment may vary due to age or medical condition  Airway Management Planned: Oral ETT and Double Lumen EBT  Additional Equipment: Arterial line, CVP and Ultrasound Guidance Line Placement  Intra-op Plan:   Post-operative Plan:   Informed Consent: I have reviewed the patients History and Physical, chart, labs and discussed the procedure including the risks, benefits and alternatives for the proposed anesthesia with the patient or authorized representative who has indicated his/her understanding and acceptance.     Dental advisory given  Plan Discussed with: CRNA  Anesthesia Plan Comments:        Anesthesia Quick Evaluation

## 2018-10-16 NOTE — Progress Notes (Signed)
Pt stated she had open wound on her buttock and it was draining,and painful.No PCP.No cardiac issues per pt

## 2018-10-17 ENCOUNTER — Inpatient Hospital Stay (HOSPITAL_COMMUNITY): Payer: 59

## 2018-10-17 ENCOUNTER — Other Ambulatory Visit: Payer: Self-pay

## 2018-10-17 ENCOUNTER — Inpatient Hospital Stay (HOSPITAL_COMMUNITY)
Admission: RE | Admit: 2018-10-17 | Discharge: 2018-10-24 | DRG: 164 | Disposition: A | Discharge status: Home / Self Care | Payer: 59 | Attending: Thoracic Surgery (Cardiothoracic Vascular Surgery) | Admitting: Thoracic Surgery (Cardiothoracic Vascular Surgery)

## 2018-10-17 ENCOUNTER — Inpatient Hospital Stay (HOSPITAL_COMMUNITY): Payer: 59 | Admitting: Certified Registered Nurse Anesthetist

## 2018-10-17 ENCOUNTER — Encounter: Payer: 59 | Admitting: Thoracic Surgery (Cardiothoracic Vascular Surgery)

## 2018-10-17 ENCOUNTER — Encounter (HOSPITAL_COMMUNITY): Payer: Self-pay | Admitting: Certified Registered Nurse Anesthetist

## 2018-10-17 ENCOUNTER — Encounter (HOSPITAL_COMMUNITY)
Admission: RE | Disposition: A | Discharge status: Home / Self Care | Payer: Self-pay | Source: Home / Self Care | Attending: Thoracic Surgery (Cardiothoracic Vascular Surgery)

## 2018-10-17 DIAGNOSIS — J9811 Atelectasis: Secondary | ICD-10-CM | POA: Diagnosis present

## 2018-10-17 DIAGNOSIS — Z79899 Other long term (current) drug therapy: Secondary | ICD-10-CM

## 2018-10-17 DIAGNOSIS — Z9889 Other specified postprocedural states: Secondary | ICD-10-CM

## 2018-10-17 DIAGNOSIS — D3A090 Benign carcinoid tumor of the bronchus and lung: Secondary | ICD-10-CM | POA: Diagnosis present

## 2018-10-17 DIAGNOSIS — Z8701 Personal history of pneumonia (recurrent): Secondary | ICD-10-CM | POA: Diagnosis not present

## 2018-10-17 DIAGNOSIS — J9383 Other pneumothorax: Secondary | ICD-10-CM | POA: Diagnosis not present

## 2018-10-17 DIAGNOSIS — J45909 Unspecified asthma, uncomplicated: Secondary | ICD-10-CM | POA: Diagnosis present

## 2018-10-17 DIAGNOSIS — E669 Obesity, unspecified: Secondary | ICD-10-CM | POA: Diagnosis present

## 2018-10-17 DIAGNOSIS — C7A090 Malignant carcinoid tumor of the bronchus and lung: Secondary | ICD-10-CM

## 2018-10-17 DIAGNOSIS — L8990 Pressure ulcer of unspecified site, unspecified stage: Secondary | ICD-10-CM

## 2018-10-17 DIAGNOSIS — J90 Pleural effusion, not elsewhere classified: Secondary | ICD-10-CM | POA: Diagnosis present

## 2018-10-17 DIAGNOSIS — D491 Neoplasm of unspecified behavior of respiratory system: Secondary | ICD-10-CM

## 2018-10-17 DIAGNOSIS — Z6836 Body mass index (BMI) 36.0-36.9, adult: Secondary | ICD-10-CM

## 2018-10-17 DIAGNOSIS — D62 Acute posthemorrhagic anemia: Secondary | ICD-10-CM | POA: Diagnosis not present

## 2018-10-17 DIAGNOSIS — K121 Other forms of stomatitis: Secondary | ICD-10-CM | POA: Diagnosis present

## 2018-10-17 DIAGNOSIS — T17998A Other foreign object in respiratory tract, part unspecified causing other injury, initial encounter: Secondary | ICD-10-CM | POA: Diagnosis present

## 2018-10-17 DIAGNOSIS — Z7951 Long term (current) use of inhaled steroids: Secondary | ICD-10-CM | POA: Diagnosis not present

## 2018-10-17 DIAGNOSIS — K900 Celiac disease: Secondary | ICD-10-CM | POA: Diagnosis present

## 2018-10-17 DIAGNOSIS — J939 Pneumothorax, unspecified: Secondary | ICD-10-CM

## 2018-10-17 DIAGNOSIS — Z902 Acquired absence of lung [part of]: Secondary | ICD-10-CM

## 2018-10-17 DIAGNOSIS — R918 Other nonspecific abnormal finding of lung field: Secondary | ICD-10-CM

## 2018-10-17 HISTORY — PX: LOBECTOMY: SHX5089

## 2018-10-17 HISTORY — PX: VIDEO BRONCHOSCOPY: SHX5072

## 2018-10-17 HISTORY — PX: VIDEO ASSISTED THORACOSCOPY: SHX5073

## 2018-10-17 LAB — GRAM STAIN

## 2018-10-17 LAB — POCT PREGNANCY, URINE: Preg Test, Ur: NEGATIVE

## 2018-10-17 SURGERY — BRONCHOSCOPY, VIDEO-ASSISTED
Anesthesia: General | Site: Chest | Laterality: Right

## 2018-10-17 MED ORDER — HEMOSTATIC AGENTS (NO CHARGE) OPTIME
TOPICAL | Status: DC | PRN
  Administered 2018-10-17: 2 via TOPICAL
  Administered 2018-10-17: 1 via TOPICAL

## 2018-10-17 MED ORDER — DIPHENHYDRAMINE HCL 50 MG/ML IJ SOLN
INTRAMUSCULAR | Status: AC
  Filled 2018-10-17: qty 1

## 2018-10-17 MED ORDER — 0.9 % SODIUM CHLORIDE (POUR BTL) OPTIME
TOPICAL | Status: DC | PRN
  Administered 2018-10-17: 4000 mL

## 2018-10-17 MED ORDER — FENTANYL CITRATE (PF) 250 MCG/5ML IJ SOLN
INTRAMUSCULAR | Status: DC | PRN
  Administered 2018-10-17 (×4): 50 ug via INTRAVENOUS
  Administered 2018-10-17: 25 ug via INTRAVENOUS
  Administered 2018-10-17 (×3): 50 ug via INTRAVENOUS
  Administered 2018-10-17: 25 ug via INTRAVENOUS
  Administered 2018-10-17 (×2): 50 ug via INTRAVENOUS

## 2018-10-17 MED ORDER — HYDROMORPHONE HCL 1 MG/ML IJ SOLN
INTRAMUSCULAR | Status: AC
  Filled 2018-10-17: qty 0.5

## 2018-10-17 MED ORDER — KETOROLAC TROMETHAMINE 15 MG/ML IJ SOLN
INTRAMUSCULAR | Status: AC
  Filled 2018-10-17: qty 1

## 2018-10-17 MED ORDER — SODIUM CHLORIDE 0.9 % IV SOLN
INTRAVENOUS | Status: DC | PRN
  Administered 2018-10-17: 70 mL

## 2018-10-17 MED ORDER — LEVALBUTEROL HCL 0.63 MG/3ML IN NEBU
0.6300 mg | INHALATION_SOLUTION | Freq: Four times a day (QID) | RESPIRATORY_TRACT | Status: DC
  Administered 2018-10-17 – 2018-10-24 (×24): 0.63 mg via RESPIRATORY_TRACT
  Filled 2018-10-17 (×26): qty 3

## 2018-10-17 MED ORDER — HYDROMORPHONE HCL 1 MG/ML IJ SOLN
INTRAMUSCULAR | Status: AC
  Administered 2018-10-17: 0.5 mg via INTRAVENOUS
  Filled 2018-10-17: qty 1

## 2018-10-17 MED ORDER — ROCURONIUM BROMIDE 50 MG/5ML IV SOSY
PREFILLED_SYRINGE | INTRAVENOUS | Status: AC
  Filled 2018-10-17: qty 5

## 2018-10-17 MED ORDER — TRAMADOL HCL 50 MG PO TABS
50.0000 mg | ORAL_TABLET | Freq: Four times a day (QID) | ORAL | Status: DC | PRN
  Administered 2018-10-19: 100 mg via ORAL
  Filled 2018-10-17: qty 2

## 2018-10-17 MED ORDER — NORETHIN ACE-ETH ESTRAD-FE 1-20 MG-MCG(24) PO CHEW: 1.0000 | CHEWABLE_TABLET | Freq: Every evening | ORAL | Status: DC

## 2018-10-17 MED ORDER — PROPOFOL 10 MG/ML IV BOLUS
INTRAVENOUS | Status: AC
  Filled 2018-10-17: qty 20

## 2018-10-17 MED ORDER — NALOXONE HCL 0.4 MG/ML IJ SOLN: 0.4000 mg | INTRAMUSCULAR | Status: DC | PRN

## 2018-10-17 MED ORDER — FENTANYL CITRATE (PF) 250 MCG/5ML IJ SOLN
INTRAMUSCULAR | Status: AC
  Filled 2018-10-17: qty 5

## 2018-10-17 MED ORDER — BUPIVACAINE HCL (PF) 0.5 % IJ SOLN
INTRAMUSCULAR | Status: DC | PRN
  Administered 2018-10-17: 30 mL

## 2018-10-17 MED ORDER — PROPOFOL 10 MG/ML IV BOLUS
INTRAVENOUS | Status: DC | PRN
  Administered 2018-10-17: 200 mg via INTRAVENOUS
  Administered 2018-10-17 (×2): 20 mg via INTRAVENOUS

## 2018-10-17 MED ORDER — LACTATED RINGERS IV SOLN
INTRAVENOUS | Status: DC
  Administered 2018-10-17: 08:00:00 via INTRAVENOUS

## 2018-10-17 MED ORDER — MORPHINE SULFATE 2 MG/ML IV SOLN
INTRAVENOUS | Status: DC
  Administered 2018-10-17: 12 mg via INTRAVENOUS
  Administered 2018-10-17: 2 mg via INTRAVENOUS
  Administered 2018-10-17 – 2018-10-18 (×2): 13.5 mg via INTRAVENOUS
  Administered 2018-10-18: 19.5 mg via INTRAVENOUS
  Administered 2018-10-18: 3 mg via INTRAVENOUS
  Administered 2018-10-18: 12 mg via INTRAVENOUS
  Administered 2018-10-18: 06:00:00 via INTRAVENOUS
  Administered 2018-10-19: 6 mg via INTRAVENOUS
  Administered 2018-10-19: 19:00:00 via INTRAVENOUS
  Administered 2018-10-19: 10.5 mg via INTRAVENOUS
  Administered 2018-10-19: 16.5 mg via INTRAVENOUS
  Administered 2018-10-19: 18 mg via INTRAVENOUS
  Administered 2018-10-19: 01:00:00 via INTRAVENOUS
  Administered 2018-10-19: 9 mg via INTRAVENOUS
  Administered 2018-10-19: 13.5 mg via INTRAVENOUS
  Administered 2018-10-19: 10.5 mg via INTRAVENOUS
  Administered 2018-10-20: 9 mg via INTRAVENOUS
  Administered 2018-10-20: 6 mg via INTRAVENOUS
  Filled 2018-10-17 (×2): qty 60
  Filled 2018-10-17 (×2): qty 50

## 2018-10-17 MED ORDER — ONDANSETRON HCL 4 MG/2ML IJ SOLN
INTRAMUSCULAR | Status: AC
  Filled 2018-10-17: qty 2

## 2018-10-17 MED ORDER — DIPHENHYDRAMINE HCL 50 MG/ML IJ SOLN
12.5000 mg | Freq: Four times a day (QID) | INTRAMUSCULAR | Status: DC | PRN
  Administered 2018-10-18 – 2018-10-20 (×3): 12.5 mg via INTRAVENOUS
  Filled 2018-10-17 (×3): qty 1

## 2018-10-17 MED ORDER — LIDOCAINE 2% (20 MG/ML) 5 ML SYRINGE
INTRAMUSCULAR | Status: DC | PRN
  Administered 2018-10-17: 40 mg via INTRAVENOUS

## 2018-10-17 MED ORDER — ENOXAPARIN SODIUM 40 MG/0.4ML ~~LOC~~ SOLN
40.0000 mg | SUBCUTANEOUS | Status: DC
  Administered 2018-10-17 – 2018-10-23 (×7): 40 mg via SUBCUTANEOUS
  Filled 2018-10-17 (×6): qty 0.4

## 2018-10-17 MED ORDER — LACTATED RINGERS IV SOLN
INTRAVENOUS | Status: DC | PRN
  Administered 2018-10-17 (×3): via INTRAVENOUS

## 2018-10-17 MED ORDER — METOCLOPRAMIDE HCL 5 MG/ML IJ SOLN
10.0000 mg | Freq: Four times a day (QID) | INTRAMUSCULAR | Status: AC
  Administered 2018-10-17 – 2018-10-18 (×3): 10 mg via INTRAVENOUS
  Filled 2018-10-17 (×4): qty 2

## 2018-10-17 MED ORDER — PHENYLEPHRINE 40 MCG/ML (10ML) SYRINGE FOR IV PUSH (FOR BLOOD PRESSURE SUPPORT)
PREFILLED_SYRINGE | INTRAVENOUS | Status: DC | PRN
  Administered 2018-10-17 (×3): 80 ug via INTRAVENOUS

## 2018-10-17 MED ORDER — KETOROLAC TROMETHAMINE 15 MG/ML IJ SOLN
15.0000 mg | Freq: Four times a day (QID) | INTRAMUSCULAR | Status: DC | PRN
  Administered 2018-10-17: 15 mg via INTRAVENOUS

## 2018-10-17 MED ORDER — ACETAMINOPHEN 500 MG PO TABS
1000.0000 mg | ORAL_TABLET | Freq: Four times a day (QID) | ORAL | Status: AC
  Administered 2018-10-17 – 2018-10-21 (×16): 1000 mg via ORAL
  Filled 2018-10-17 (×16): qty 2

## 2018-10-17 MED ORDER — DEXAMETHASONE SODIUM PHOSPHATE 10 MG/ML IJ SOLN
INTRAMUSCULAR | Status: AC
  Filled 2018-10-17: qty 1

## 2018-10-17 MED ORDER — DIPHENHYDRAMINE HCL 12.5 MG/5ML PO ELIX
12.5000 mg | ORAL_SOLUTION | Freq: Four times a day (QID) | ORAL | Status: DC | PRN
  Filled 2018-10-17: qty 5

## 2018-10-17 MED ORDER — BUPIVACAINE LIPOSOME 1.3 % IJ SUSP
20.0000 mL | Freq: Once | INTRAMUSCULAR | Status: DC
  Filled 2018-10-17: qty 20

## 2018-10-17 MED ORDER — VANCOMYCIN HCL IN DEXTROSE 1-5 GM/200ML-% IV SOLN
1000.0000 mg | Freq: Two times a day (BID) | INTRAVENOUS | Status: AC
  Administered 2018-10-17: 1000 mg via INTRAVENOUS
  Filled 2018-10-17: qty 200

## 2018-10-17 MED ORDER — MIDAZOLAM HCL 2 MG/2ML IJ SOLN
INTRAMUSCULAR | Status: DC | PRN
  Administered 2018-10-17: 2 mg via INTRAVENOUS

## 2018-10-17 MED ORDER — SENNOSIDES-DOCUSATE SODIUM 8.6-50 MG PO TABS
1.0000 | ORAL_TABLET | Freq: Every day | ORAL | Status: DC
  Administered 2018-10-17 – 2018-10-19 (×3): 1 via ORAL
  Filled 2018-10-17 (×5): qty 1

## 2018-10-17 MED ORDER — HYDROMORPHONE HCL 1 MG/ML IJ SOLN
INTRAMUSCULAR | Status: DC | PRN
  Administered 2018-10-17: 0.5 mg via INTRAVENOUS

## 2018-10-17 MED ORDER — SODIUM CHLORIDE 0.9% FLUSH: 9.0000 mL | INTRAVENOUS | Status: DC | PRN

## 2018-10-17 MED ORDER — ONDANSETRON HCL 4 MG/2ML IJ SOLN
INTRAMUSCULAR | Status: DC | PRN
  Administered 2018-10-17: 4 mg via INTRAVENOUS

## 2018-10-17 MED ORDER — EPINEPHRINE PF 1 MG/ML IJ SOLN
INTRAMUSCULAR | Status: AC
  Filled 2018-10-17: qty 1

## 2018-10-17 MED ORDER — DEXAMETHASONE SODIUM PHOSPHATE 10 MG/ML IJ SOLN
INTRAMUSCULAR | Status: DC | PRN
  Administered 2018-10-17: 10 mg via INTRAVENOUS

## 2018-10-17 MED ORDER — HYDROMORPHONE HCL 1 MG/ML IJ SOLN
0.2500 mg | INTRAMUSCULAR | Status: DC | PRN
  Administered 2018-10-17 (×3): 0.5 mg via INTRAVENOUS

## 2018-10-17 MED ORDER — ONDANSETRON HCL 4 MG/2ML IJ SOLN: 4.0000 mg | Freq: Four times a day (QID) | INTRAMUSCULAR | Status: DC | PRN

## 2018-10-17 MED ORDER — KETOROLAC TROMETHAMINE 0.5 % OP SOLN
OPHTHALMIC | Status: AC
  Administered 2018-10-17: 18:00:00
  Filled 2018-10-17: qty 5

## 2018-10-17 MED ORDER — LORATADINE 10 MG PO TABS
10.0000 mg | ORAL_TABLET | Freq: Every day | ORAL | Status: DC
  Administered 2018-10-18 – 2018-10-24 (×6): 10 mg via ORAL
  Filled 2018-10-17 (×6): qty 1

## 2018-10-17 MED ORDER — HYDROMORPHONE HCL 1 MG/ML IJ SOLN
INTRAMUSCULAR | Status: AC
  Filled 2018-10-17: qty 1

## 2018-10-17 MED ORDER — ROCURONIUM BROMIDE 10 MG/ML (PF) SYRINGE
PREFILLED_SYRINGE | INTRAVENOUS | Status: DC | PRN
  Administered 2018-10-17 (×3): 50 mg via INTRAVENOUS
  Administered 2018-10-17 (×2): 20 mg via INTRAVENOUS
  Administered 2018-10-17: 10 mg via INTRAVENOUS
  Administered 2018-10-17: 20 mg via INTRAVENOUS

## 2018-10-17 MED ORDER — SODIUM CHLORIDE 0.9 % IV SOLN
INTRAVENOUS | Status: DC | PRN
  Administered 2018-10-17: 25 ug/min via INTRAVENOUS

## 2018-10-17 MED ORDER — PROMETHAZINE HCL 25 MG/ML IJ SOLN: 6.2500 mg | INTRAMUSCULAR | Status: DC | PRN

## 2018-10-17 MED ORDER — DIPHENHYDRAMINE HCL 50 MG/ML IJ SOLN
INTRAMUSCULAR | Status: DC | PRN
  Administered 2018-10-17: 12.5 mg via INTRAVENOUS

## 2018-10-17 MED ORDER — BENZONATATE 100 MG PO CAPS
200.0000 mg | ORAL_CAPSULE | Freq: Three times a day (TID) | ORAL | Status: DC | PRN
  Administered 2018-10-18: 200 mg via ORAL
  Filled 2018-10-17: qty 2

## 2018-10-17 MED ORDER — PHENYLEPHRINE 40 MCG/ML (10ML) SYRINGE FOR IV PUSH (FOR BLOOD PRESSURE SUPPORT)
PREFILLED_SYRINGE | INTRAVENOUS | Status: AC
  Filled 2018-10-17: qty 10

## 2018-10-17 MED ORDER — ONDANSETRON HCL 4 MG/2ML IJ SOLN
4.0000 mg | Freq: Four times a day (QID) | INTRAMUSCULAR | Status: DC | PRN
  Administered 2018-10-17 – 2018-10-19 (×4): 4 mg via INTRAVENOUS
  Filled 2018-10-17 (×4): qty 2

## 2018-10-17 MED ORDER — LIDOCAINE 2% (20 MG/ML) 5 ML SYRINGE
INTRAMUSCULAR | Status: AC
  Filled 2018-10-17: qty 5

## 2018-10-17 MED ORDER — POTASSIUM CHLORIDE 10 MEQ/50ML IV SOLN: 10.0000 meq | Freq: Every day | INTRAVENOUS | Status: DC | PRN

## 2018-10-17 MED ORDER — BISACODYL 5 MG PO TBEC
10.0000 mg | DELAYED_RELEASE_TABLET | Freq: Every day | ORAL | Status: DC
  Administered 2018-10-18 – 2018-10-19 (×2): 10 mg via ORAL
  Filled 2018-10-17 (×4): qty 2

## 2018-10-17 MED ORDER — SUGAMMADEX SODIUM 200 MG/2ML IV SOLN
INTRAVENOUS | Status: DC | PRN
  Administered 2018-10-17: 208.6 mg via INTRAVENOUS

## 2018-10-17 MED ORDER — OXYCODONE HCL 5 MG PO TABS
5.0000 mg | ORAL_TABLET | ORAL | Status: DC | PRN
  Administered 2018-10-18 (×4): 10 mg via ORAL
  Filled 2018-10-17 (×4): qty 2

## 2018-10-17 MED ORDER — ACETAMINOPHEN 500 MG PO TABS
1000.0000 mg | ORAL_TABLET | Freq: Once | ORAL | Status: DC
  Filled 2018-10-17: qty 2

## 2018-10-17 MED ORDER — OXYCODONE HCL 5 MG/5ML PO SOLN: 5.0000 mg | Freq: Once | ORAL | Status: DC | PRN

## 2018-10-17 MED ORDER — ACETAMINOPHEN 160 MG/5ML PO SOLN: 1000.0000 mg | Freq: Four times a day (QID) | ORAL | Status: AC

## 2018-10-17 MED ORDER — DEXTROSE-NACL 5-0.9 % IV SOLN
INTRAVENOUS | Status: DC
  Administered 2018-10-17 – 2018-10-18 (×2): via INTRAVENOUS

## 2018-10-17 MED ORDER — BUPIVACAINE HCL (PF) 0.5 % IJ SOLN
INTRAMUSCULAR | Status: AC
  Filled 2018-10-17: qty 30

## 2018-10-17 MED ORDER — MIDAZOLAM HCL 2 MG/2ML IJ SOLN
INTRAMUSCULAR | Status: AC
  Filled 2018-10-17: qty 2

## 2018-10-17 MED ORDER — VANCOMYCIN HCL 10 G IV SOLR
1500.0000 mg | INTRAVENOUS | Status: AC
  Administered 2018-10-17: 1500 mg via INTRAVENOUS
  Filled 2018-10-17 (×2): qty 1500

## 2018-10-17 MED ORDER — OXYCODONE HCL 5 MG PO TABS: 5.0000 mg | ORAL_TABLET | Freq: Once | ORAL | Status: DC | PRN

## 2018-10-17 MED ORDER — ALBUMIN HUMAN 5 % IV SOLN
INTRAVENOUS | Status: DC | PRN
  Administered 2018-10-17: 15:00:00 via INTRAVENOUS

## 2018-10-17 SURGICAL SUPPLY — 121 items
ADAPTER VALVE BIOPSY EBUS (MISCELLANEOUS) IMPLANT
ADH SKN CLS APL DERMABOND .7 (GAUZE/BANDAGES/DRESSINGS)
ADH SKN CLS LQ APL DERMABOND (GAUZE/BANDAGES/DRESSINGS) ×2
ADH SRG 12 PREFL SYR 3 SPRDR (MISCELLANEOUS) ×4
ADPTR VALVE BIOPSY EBUS (MISCELLANEOUS)
APL SWBSTK 6 STRL LF DISP (MISCELLANEOUS) ×4
APPLICATOR COTTON TIP 6 STRL (MISCELLANEOUS) IMPLANT
APPLICATOR COTTON TIP 6IN STRL (MISCELLANEOUS) ×8
APPLIER CLIP ROT 10 11.4 M/L (STAPLE) ×4
APR CLP MED LRG 11.4X10 (STAPLE) ×2
BAG SPEC RTRVL LRG 6X4 10 (ENDOMECHANICALS)
BLADE SURG 11 STRL SS (BLADE) ×2 IMPLANT
BRUSH CYTOL CELLEBRITY 1.5X140 (MISCELLANEOUS) IMPLANT
CANISTER SUCT 3000ML PPV (MISCELLANEOUS) ×4 IMPLANT
CATH THORACIC 28FR (CATHETERS) ×2 IMPLANT
CATH THORACIC 28FR RT ANG (CATHETERS) IMPLANT
CATH THORACIC 36FR (CATHETERS) IMPLANT
CATH THORACIC 36FR RT ANG (CATHETERS) IMPLANT
CLIP APPLIE ROT 10 11.4 M/L (STAPLE) IMPLANT
CLIP VESOCCLUDE MED 6/CT (CLIP) IMPLANT
CONN Y 3/8X3/8X3/8  BEN (MISCELLANEOUS) ×2
CONN Y 3/8X3/8X3/8 BEN (MISCELLANEOUS) ×2 IMPLANT
CONT SPEC 4OZ CLIKSEAL STRL BL (MISCELLANEOUS) ×24 IMPLANT
COVER BACK TABLE 60X90IN (DRAPES) ×4 IMPLANT
COVER SURGICAL LIGHT HANDLE (MISCELLANEOUS) ×4 IMPLANT
COVER WAND RF STERILE (DRAPES) ×2 IMPLANT
CUTTER ECHEON FLEX ENDO 45 340 (ENDOMECHANICALS) ×2 IMPLANT
DERMABOND ADHESIVE PROPEN (GAUZE/BANDAGES/DRESSINGS) ×2
DERMABOND ADVANCED (GAUZE/BANDAGES/DRESSINGS)
DERMABOND ADVANCED .7 DNX12 (GAUZE/BANDAGES/DRESSINGS) IMPLANT
DERMABOND ADVANCED .7 DNX6 (GAUZE/BANDAGES/DRESSINGS) IMPLANT
DEVICE TROCAR PUNCTURE CLOSURE (ENDOMECHANICALS) ×2 IMPLANT
DRAIN CHANNEL 28F RND 3/8 FF (WOUND CARE) IMPLANT
DRAIN CHANNEL 32F RND 10.7 FF (WOUND CARE) IMPLANT
DRAPE CV SPLIT W-CLR ANES SCRN (DRAPES) ×2 IMPLANT
DRAPE LAPAROSCOPIC ABDOMINAL (DRAPES) ×4 IMPLANT
DRAPE ORTHO SPLIT 77X108 STRL (DRAPES) ×4
DRAPE SLUSH/WARMER DISC (DRAPES) ×4 IMPLANT
DRAPE SURG ORHT 6 SPLT 77X108 (DRAPES) IMPLANT
ELECT REM PT RETURN 9FT ADLT (ELECTROSURGICAL) ×4
ELECTRODE REM PT RTRN 9FT ADLT (ELECTROSURGICAL) ×2 IMPLANT
FELT TEFLON 1X6 (MISCELLANEOUS) ×2 IMPLANT
FILTER STRAW FLUID ASPIR (MISCELLANEOUS) IMPLANT
FORCEPS BIOP RJ4 1.8 (CUTTING FORCEPS) IMPLANT
FORCEPS RADIAL JAW LRG 4 PULM (INSTRUMENTS) IMPLANT
GAUZE SPONGE 4X4 12PLY STRL (GAUZE/BANDAGES/DRESSINGS) ×6 IMPLANT
GLOVE BIO SURGEON STRL SZ 6 (GLOVE) ×2 IMPLANT
GLOVE BIO SURGEON STRL SZ 6.5 (GLOVE) ×4 IMPLANT
GLOVE BIO SURGEONS STRL SZ 6.5 (GLOVE) ×4
GLOVE SURG SIGNA 7.5 PF LTX (GLOVE) ×8 IMPLANT
GOWN STRL REUS W/ TWL LRG LVL3 (GOWN DISPOSABLE) ×4 IMPLANT
GOWN STRL REUS W/ TWL XL LVL3 (GOWN DISPOSABLE) ×2 IMPLANT
GOWN STRL REUS W/TWL LRG LVL3 (GOWN DISPOSABLE) ×32
GOWN STRL REUS W/TWL XL LVL3 (GOWN DISPOSABLE) ×8
HEMOSTAT SURGICEL 2X14 (HEMOSTASIS) ×4 IMPLANT
IV CATH 22GX1 FEP (IV SOLUTION) ×2 IMPLANT
KIT BASIN OR (CUSTOM PROCEDURE TRAY) ×4 IMPLANT
KIT CLEAN ENDO COMPLIANCE (KITS) ×8 IMPLANT
KIT SUCTION CATH 14FR (SUCTIONS) ×4 IMPLANT
KIT TURNOVER KIT B (KITS) ×4 IMPLANT
MARKER SKIN DUAL TIP RULER LAB (MISCELLANEOUS) ×4 IMPLANT
NS IRRIG 1000ML POUR BTL (IV SOLUTION) ×10 IMPLANT
OIL SILICONE PENTAX (PARTS (SERVICE/REPAIRS)) ×4 IMPLANT
PACK CHEST (CUSTOM PROCEDURE TRAY) ×4 IMPLANT
PAD ARMBOARD 7.5X6 YLW CONV (MISCELLANEOUS) ×8 IMPLANT
POUCH ENDO CATCH II 15MM (MISCELLANEOUS) ×2 IMPLANT
POUCH SPECIMEN RETRIEVAL 10MM (ENDOMECHANICALS) IMPLANT
PROGEL SPRAY TIP 11IN (MISCELLANEOUS) ×8
RADIAL JAW LRG 4 PULMONARY (INSTRUMENTS)
RELOAD STAPLE 35X2.5 WHT THIN (STAPLE) IMPLANT
RELOAD STAPLE 45 GOLD REG/THCK (STAPLE) IMPLANT
SEALANT PROGEL (MISCELLANEOUS) ×4 IMPLANT
SEALANT SURG COSEAL 4ML (VASCULAR PRODUCTS) IMPLANT
SEALANT SURG COSEAL 8ML (VASCULAR PRODUCTS) IMPLANT
SHEARS HARMONIC HDI 20CM (ELECTROSURGICAL) ×2 IMPLANT
SOLUTION ANTI FOG 6CC (MISCELLANEOUS) ×4 IMPLANT
SPECIMEN JAR MEDIUM (MISCELLANEOUS) ×4 IMPLANT
SPONGE INTESTINAL PEANUT (DISPOSABLE) ×14 IMPLANT
SPONGE TONSIL TAPE 1 RFD (DISPOSABLE) ×4 IMPLANT
STAPLE RELOAD 2.5MM WHITE (STAPLE) ×16 IMPLANT
STAPLE RELOAD 45MM GOLD (STAPLE) ×56 IMPLANT
STAPLER VASCULAR ECHELON 35 (CUTTER) ×2 IMPLANT
SUT PDS AB 3-0 SH 27 (SUTURE) ×50 IMPLANT
SUT PROLENE 4 0 RB 1 (SUTURE) ×12
SUT PROLENE 4-0 RB1 .5 CRCL 36 (SUTURE) IMPLANT
SUT SILK  1 MH (SUTURE) ×4
SUT SILK 1 MH (SUTURE) ×4 IMPLANT
SUT SILK 2 0SH CR/8 30 (SUTURE) ×2 IMPLANT
SUT SILK 3 0SH CR/8 30 (SUTURE) ×2 IMPLANT
SUT VIC AB 1 CTX 36 (SUTURE) ×8
SUT VIC AB 1 CTX36XBRD ANBCTR (SUTURE) IMPLANT
SUT VIC AB 2-0 CTX 36 (SUTURE) IMPLANT
SUT VIC AB 2-0 UR6 27 (SUTURE) IMPLANT
SUT VIC AB 3-0 MH 27 (SUTURE) IMPLANT
SUT VIC AB 3-0 X1 27 (SUTURE) ×4 IMPLANT
SUT VICRYL 2 TP 1 (SUTURE) IMPLANT
SYR 10ML KIT SKIN ADHESIVE (MISCELLANEOUS) ×4 IMPLANT
SYR 20CC LL (SYRINGE) IMPLANT
SYR 20ML ECCENTRIC (SYRINGE) ×8 IMPLANT
SYR 50ML SLIP (SYRINGE) ×2 IMPLANT
SYR 5ML LL (SYRINGE) ×4 IMPLANT
SYR 5ML LUER SLIP (SYRINGE) ×4 IMPLANT
SYRINGE 20CC LL (MISCELLANEOUS) ×2 IMPLANT
SYSTEM SAHARA CHEST DRAIN ATS (WOUND CARE) ×4 IMPLANT
TAPE CLOTH 4X10 WHT NS (GAUZE/BANDAGES/DRESSINGS) ×4 IMPLANT
TAPE CLOTH SURG 4X10 WHT LF (GAUZE/BANDAGES/DRESSINGS) ×4 IMPLANT
TIP APPLICATOR SPRAY EXTEND 16 (VASCULAR PRODUCTS) ×4 IMPLANT
TIP SPRAY PROGEL 11IN (MISCELLANEOUS) IMPLANT
TOWEL GREEN STERILE (TOWEL DISPOSABLE) ×4 IMPLANT
TOWEL GREEN STERILE FF (TOWEL DISPOSABLE) ×4 IMPLANT
TRAP SPECIMEN MUCOUS 40CC (MISCELLANEOUS) ×6 IMPLANT
TRAY FOLEY MTR SLVR 16FR STAT (SET/KITS/TRAYS/PACK) ×4 IMPLANT
TROCAR BLADELESS 5M (ENDOMECHANICALS) ×2 IMPLANT
TROCAR XCEL BLADELESS 5X75MML (TROCAR) ×4 IMPLANT
TROCAR XCEL NON-BLD 5MMX100MML (ENDOMECHANICALS) IMPLANT
TUBE CONNECTING 20'X1/4 (TUBING) ×1
TUBE CONNECTING 20X1/4 (TUBING) ×3 IMPLANT
VALVE BIOPSY  SINGLE USE (MISCELLANEOUS) ×6
VALVE BIOPSY SINGLE USE (MISCELLANEOUS) ×2 IMPLANT
VALVE SUCTION BRONCHIO DISP (MISCELLANEOUS) ×8 IMPLANT
WATER STERILE IRR 1000ML POUR (IV SOLUTION) ×8 IMPLANT

## 2018-10-17 NOTE — Brief Op Note (Signed)
10/17/2018  4:28 PM  PATIENT:  Susan Elliott  23 y.o. female  PRE-OPERATIVE DIAGNOSIS:  ENDOBRONCHIAL TUMOR  POST-OPERATIVE DIAGNOSIS:  ENDOBRONCHIAL TUMOR  PROCEDURE:  Procedure(s):  VIDEO BRONCHOSCOPY (N/A)  VIDEO ASSISTED THORACOSCOPY (Right) -Right Middle Lobectomy -Right Lower Lobectomy  LYMPH NODE SAMPLING  INTERCOSTAL NERVE BLOCK   SURGEON:  Surgeon(s) and Role:    * Melrose Nakayama, MD - Primary  PHYSICIAN ASSISTANT: Ezzard Flax PA-C, Girl Schissler PA-C  ANESTHESIA:   general  EBL:  500 mL   BLOOD ADMINISTERED:none  DRAINS: 28 Straight Right Chest   LOCAL MEDICATIONS USED:  MARCAINE    and BUPIVICAINE   SPECIMEN:  Source of Specimen:  Right Middle Lobe, Right Lower Lobe, Lymph Nodes  DISPOSITION OF SPECIMEN:  PATHOLOGY  COUNTS:  YES  TOURNIQUET:  * No tourniquets in log *  DICTATION: .Dragon Dictation  PLAN OF CARE: Admit to inpatient   PATIENT DISPOSITION:  PACU - hemodynamically stable.   Delay start of Pharmacological VTE agent (>24hrs) due to surgical blood loss or risk of bleeding: no

## 2018-10-17 NOTE — Anesthesia Procedure Notes (Addendum)
Procedure Name: Intubation Date/Time: 10/17/2018 8:50 AM Performed by: Alain Marion, CRNA Pre-anesthesia Checklist: Patient identified, Emergency Drugs available, Suction available and Patient being monitored Patient Re-evaluated:Patient Re-evaluated prior to induction Oxygen Delivery Method: Circle System Utilized Preoxygenation: Pre-oxygenation with 100% oxygen Induction Type: IV induction Ventilation: Mask ventilation without difficulty Laryngoscope Size: Miller and 2 Grade View: Grade I Tube type: Oral Tube size: 8.5 mm Number of attempts: 1 Airway Equipment and Method: Stylet and Oral airway Placement Confirmation: ETT inserted through vocal cords under direct vision,  positive ETCO2 and breath sounds checked- equal and bilateral Secured at: 22 cm Tube secured with: Tape Dental Injury: Teeth and Oropharynx as per pre-operative assessment

## 2018-10-17 NOTE — OR Nursing (Signed)
Family notified of patient progress at 13

## 2018-10-17 NOTE — Interval H&P Note (Signed)
History and Physical Interval Note:  10/17/2018 8:01 AM  [name]  has presented today for surgery, with the diagnosis of ENDOBRONCHIAL TUMOR  The various methods of treatment have been discussed with the patient and family. After consideration of risks, benefits and other options for treatment, the patient has consented to  Procedure(s): VIDEO BRONCHOSCOPY (N/A) VIDEO ASSISTED THORACOSCOPY (Right) possible SLEEVE RESECTION (N/A) possible BILOBECTOMY (N/A) as a surgical intervention .  The patient's history has been reviewed, patient examined, no change in status, stable for surgery.  I have reviewed the patient's chart and labs.  Questions were answered to the patient's satisfaction.     Susan Elliott

## 2018-10-17 NOTE — Anesthesia Postprocedure Evaluation (Signed)
Anesthesia Post Note  Patient: LAURIEANNE GALLOWAY  Procedure(s) Performed: VIDEO BRONCHOSCOPY (N/A ) VIDEO ASSISTED THORACOSCOPY with medial lymph node dissection and intercostal nerve block (Right Chest) Right middle and lower  BILOBECTOMY (N/A )     Patient location during evaluation: PACU Anesthesia Type: General Level of consciousness: awake and alert Pain management: pain level controlled Vital Signs Assessment: post-procedure vital signs reviewed and stable Respiratory status: spontaneous breathing, nonlabored ventilation, respiratory function stable and patient connected to nasal cannula oxygen Cardiovascular status: blood pressure returned to baseline and stable Postop Assessment: no apparent nausea or vomiting Anesthetic complications: no    Last Vitals:  Vitals:   10/17/18 2018 10/17/18 2031  BP:  131/70  Pulse:  (!) 103  Resp: 18 16  Temp:  36.6 C  SpO2: 100% 98%    Last Pain:  Vitals:   10/17/18 2031  TempSrc: Oral  PainSc:                  Karyl Kinnier Mahealani Sulak

## 2018-10-17 NOTE — Anesthesia Procedure Notes (Signed)
Procedure Name: Intubation Date/Time: 10/17/2018 9:09 AM Performed by: Alain Marion, CRNA Pre-anesthesia Checklist: Patient identified, Emergency Drugs available, Suction available and Patient being monitored Patient Re-evaluated:Patient Re-evaluated prior to induction Oxygen Delivery Method: Circle System Utilized Preoxygenation: Pre-oxygenation with 100% oxygen Induction Type: IV induction Ventilation: Mask ventilation without difficulty Laryngoscope Size: Miller and 2 Grade View: Grade I Tube type: Oral Endobronchial tube: Double lumen EBT, Left, EBT position confirmed by auscultation and EBT position confirmed by fiberoptic bronchoscope and 37 Fr Number of attempts: 1 Airway Equipment and Method: Stylet and Fiberoptic brochoscope Placement Confirmation: ETT inserted through vocal cords under direct vision,  positive ETCO2 and breath sounds checked- equal and bilateral Secured at: 29 cm Tube secured with: Tape Dental Injury: Teeth and Oropharynx as per pre-operative assessment

## 2018-10-17 NOTE — Anesthesia Procedure Notes (Signed)
Arterial Line Insertion Performed by: Alain Marion, CRNA, CRNA  Preanesthetic checklist: patient identified, IV checked, site marked, risks and benefits discussed, surgical consent, monitors and equipment checked, pre-op evaluation, timeout performed and anesthesia consent Lidocaine 1% used for infiltration and patient sedated Left, radial was placed Catheter size: 20 G Hand hygiene performed  and maximum sterile barriers used   Attempts: 1 Procedure performed without using ultrasound guided technique. Ultrasound Notes:anatomy identified and no ultrasound evidence of intravascular and/or intraneural injection Following insertion, dressing applied and Biopatch. Post procedure assessment: normal  Patient tolerated the procedure well with no immediate complications.

## 2018-10-17 NOTE — Transfer of Care (Signed)
Immediate Anesthesia Transfer of Care Note  Patient: Susan Elliott  Procedure(s) Performed: VIDEO BRONCHOSCOPY (N/A ) VIDEO ASSISTED THORACOSCOPY with medial lymph node dissection and intercostal nerve block (Right Chest) Right middle and lower  BILOBECTOMY (N/A )  Patient Location: PACU  Anesthesia Type:General  Level of Consciousness: awake, alert  and oriented  Airway & Oxygen Therapy: Patient Spontanous Breathing and Patient connected to face mask oxygen  Post-op Assessment: Report given to RN and Post -op Vital signs reviewed and stable  Post vital signs: Reviewed and stable  Last Vitals:  Vitals Value Taken Time  BP 123/71 10/17/2018  4:46 PM  Temp    Pulse 117 10/17/2018  4:50 PM  Resp 25 10/17/2018  4:50 PM  SpO2 100 % 10/17/2018  4:50 PM  Vitals shown include unvalidated device data.  Last Pain:  Vitals:   10/17/18 0741  TempSrc:   PainSc: 0-No pain         Complications: No apparent anesthesia complications

## 2018-10-17 NOTE — Plan of Care (Signed)
  Problem: Education: Goal: Knowledge of General Education information will improve Description: Including pain rating scale, medication(s)/side effects and non-pharmacologic comfort measures Outcome: Progressing   Problem: Clinical Measurements: Goal: Respiratory complications will improve Outcome: Progressing   Problem: Nutrition: Goal: Adequate nutrition will be maintained Outcome: Progressing   Problem: Coping: Goal: Level of anxiety will decrease Outcome: Progressing   Problem: Pain Managment: Goal: General experience of comfort will improve Outcome: Progressing   

## 2018-10-17 NOTE — Anesthesia Procedure Notes (Signed)
Central Venous Catheter Insertion Performed by: Effie Berkshire, MD, anesthesiologist Patient location: Pre-op. Preanesthetic checklist: patient identified, IV checked, site marked, risks and benefits discussed, surgical consent, monitors and equipment checked, pre-op evaluation, timeout performed and anesthesia consent Position: Trendelenburg Lidocaine 1% used for infiltration and patient sedated Hand hygiene performed , maximum sterile barriers used  and Seldinger technique used Catheter size: 8 Fr Total catheter length 16. Central line was placed.Double lumen Procedure performed using ultrasound guided technique. Ultrasound Notes:anatomy identified, needle tip was noted to be adjacent to the nerve/plexus identified, no ultrasound evidence of intravascular and/or intraneural injection and image(s) printed for medical record Attempts: 1 Following insertion, dressing applied, line sutured and Biopatch. Post procedure assessment: blood return through all ports  Patient tolerated the procedure well with no immediate complications.

## 2018-10-18 ENCOUNTER — Inpatient Hospital Stay (HOSPITAL_COMMUNITY): Payer: 59

## 2018-10-18 ENCOUNTER — Encounter (HOSPITAL_COMMUNITY): Payer: Self-pay | Admitting: Thoracic Surgery (Cardiothoracic Vascular Surgery)

## 2018-10-18 DIAGNOSIS — L8990 Pressure ulcer of unspecified site, unspecified stage: Secondary | ICD-10-CM

## 2018-10-18 LAB — CBC
HCT: 35.5 % — ABNORMAL LOW (ref 36.0–46.0)
Hemoglobin: 11.4 g/dL — ABNORMAL LOW (ref 12.0–15.0)
MCH: 28.3 pg (ref 26.0–34.0)
MCHC: 32.1 g/dL (ref 30.0–36.0)
MCV: 88.1 fL (ref 80.0–100.0)
Platelets: 341 10*3/uL (ref 150–400)
RBC: 4.03 MIL/uL (ref 3.87–5.11)
RDW: 13.6 % (ref 11.5–15.5)
WBC: 15.9 10*3/uL — ABNORMAL HIGH (ref 4.0–10.5)
nRBC: 0 % (ref 0.0–0.2)

## 2018-10-18 LAB — BASIC METABOLIC PANEL
Anion gap: 11 (ref 5–15)
BUN: 5 mg/dL — ABNORMAL LOW (ref 6–20)
CO2: 20 mmol/L — ABNORMAL LOW (ref 22–32)
Calcium: 8.2 mg/dL — ABNORMAL LOW (ref 8.9–10.3)
Chloride: 105 mmol/L (ref 98–111)
Creatinine, Ser: 0.71 mg/dL (ref 0.44–1.00)
GFR calc Af Amer: >60 mL/min (ref >60)
GFR calc non Af Amer: >60 mL/min (ref >60)
Glucose, Bld: 144 mg/dL — ABNORMAL HIGH (ref 70–99)
Potassium: 4.2 mmol/L (ref 3.5–5.1)
Sodium: 136 mmol/L (ref 135–145)

## 2018-10-18 LAB — BLOOD GAS, ARTERIAL
Acid-base deficit: 0.2 mmol/L (ref 0.0–2.0)
Bicarbonate: 23.8 mmol/L (ref 20.0–28.0)
O2 Content: 3.5 L/min
O2 SAT: 98.6 %
Patient temperature: 98.6
pCO2 arterial: 38.6 mmHg (ref 32.0–48.0)
pH, Arterial: 7.408 (ref 7.350–7.450)
pO2, Arterial: 125 mmHg — ABNORMAL HIGH (ref 83.0–108.0)

## 2018-10-18 MED ORDER — ORAL CARE MOUTH RINSE
15.0000 mL | Freq: Two times a day (BID) | OROMUCOSAL | Status: DC
  Administered 2018-10-20 – 2018-10-24 (×5): 15 mL via OROMUCOSAL

## 2018-10-18 MED ORDER — KETOROLAC TROMETHAMINE 15 MG/ML IJ SOLN
15.0000 mg | Freq: Four times a day (QID) | INTRAMUSCULAR | Status: AC
  Administered 2018-10-18 – 2018-10-21 (×12): 15 mg via INTRAVENOUS
  Filled 2018-10-18 (×12): qty 1

## 2018-10-18 MED ORDER — LEVALBUTEROL HCL 0.63 MG/3ML IN NEBU
0.6300 mg | INHALATION_SOLUTION | RESPIRATORY_TRACT | Status: DC | PRN
  Administered 2018-10-18 – 2018-10-19 (×2): 0.63 mg via RESPIRATORY_TRACT
  Filled 2018-10-18: qty 3

## 2018-10-18 MED ORDER — PNEUMOCOCCAL VAC POLYVALENT 25 MCG/0.5ML IJ INJ: 0.5000 mL | INJECTION | INTRAMUSCULAR | Status: DC | PRN

## 2018-10-18 NOTE — Progress Notes (Signed)
Wasted 3 mL of morphine PCA in stericycle with Neldon Newport, RN.  Tressie Ellis, RN

## 2018-10-18 NOTE — Progress Notes (Signed)
1 Day Post-Op Procedure(s) (LRB): VIDEO BRONCHOSCOPY (N/A) VIDEO ASSISTED THORACOSCOPY with medial lymph node dissection and intercostal nerve block (Right) Right middle and lower  BILOBECTOMY (N/A) Subjective: Some incisional pain, denies nausea but not much of an appetite Some wheezing  Objective: Vital signs in last 24 hours: Temp:  [97.7 F (36.5 C)-98.7 F (37.1 C)] 98.7 F (37.1 C) (02/20 0502) Pulse Rate:  [103-119] 105 (02/20 0108) Cardiac Rhythm: Normal sinus rhythm;Sinus tachycardia (02/20 0431) Resp:  [16-28] 17 (02/20 0628) BP: (115-132)/(70-84) 132/74 (02/20 0502) SpO2:  [95 %-100 %] 100 % (02/20 0725) Arterial Line BP: (136-155)/(67-72) 145/72 (02/19 1815)  Hemodynamic parameters for last 24 hours:    Intake/Output from previous day: 02/19 0701 - 02/20 0700 In: 3453.1 [I.V.:3203.1; IV Piggyback:250] Out: 2293 [Urine:1325; Blood:500; Chest Tube:468] Intake/Output this shift: No intake/output data recorded.  General appearance: alert, cooperative and no distress Neurologic: intact Heart: tachy, regular Lungs: rhonchi bilaterally decreased BS right base. no air leak  Lab Results: Recent Labs    10/16/18 1328 10/18/18 0446  WBC 9.4 15.9*  HGB 12.4 11.4*  HCT 38.7 35.5*  PLT 329 341   BMET:  Recent Labs    10/16/18 1328 10/18/18 0446  NA 137 136  K 4.2 4.2  CL 106 105  CO2 21* 20*  GLUCOSE 110* 144*  BUN 7 5*  CREATININE 0.74 0.71  CALCIUM 9.2 8.2*    PT/INR:  Recent Labs    10/16/18 1328  LABPROT 13.1  INR 1.00   ABG    Component Value Date/Time   PHART 7.408 10/18/2018 0452   HCO3 23.8 10/18/2018 0452   ACIDBASEDEF 0.2 10/18/2018 0452   O2SAT 98.6 10/18/2018 0452   CBG (last 3)  No results for input(s): GLUCAP in the last 72 hours.  Assessment/Plan: S/P Procedure(s) (LRB): VIDEO BRONCHOSCOPY (N/A) VIDEO ASSISTED THORACOSCOPY with medial lymph node dissection and intercostal nerve block (Right) Right middle and lower   BILOBECTOMY (N/A) -POD # 1  Looks good this AM Continue PCA for pain, will make toradol a standing order to help with pain SCD + enoxaparin + ambulation for DVT prophylaxis IS CXR shows much improved aeration of right upper lobe   LOS: 1 day    Melrose Nakayama 10/18/2018

## 2018-10-18 NOTE — Plan of Care (Signed)
  Problem: Health Behavior/Discharge Planning: Goal: Ability to manage health-related needs will improve Outcome: Progressing   Problem: Clinical Measurements: Goal: Ability to maintain clinical measurements within normal limits will improve Outcome: Progressing Goal: Will remain free from infection Outcome: Progressing Goal: Diagnostic test results will improve Outcome: Progressing Goal: Respiratory complications will improve Outcome: Progressing Goal: Cardiovascular complication will be avoided Outcome: Progressing   Problem: Activity: Goal: Risk for activity intolerance will decrease Outcome: Progressing   Problem: Nutrition: Goal: Adequate nutrition will be maintained Outcome: Progressing   Problem: Coping: Goal: Level of anxiety will decrease Outcome: Progressing   Problem: Elimination: Goal: Will not experience complications related to bowel motility Outcome: Progressing Goal: Will not experience complications related to urinary retention Outcome: Progressing   Problem: Pain Managment: Goal: General experience of comfort will improve Outcome: Progressing   Problem: Safety: Goal: Ability to remain free from injury will improve Outcome: Progressing   Problem: Skin Integrity: Goal: Risk for impaired skin integrity will decrease Outcome: Progressing   Problem: Education: Goal: Knowledge of disease or condition will improve Outcome: Progressing Goal: Knowledge of the prescribed therapeutic regimen will improve Outcome: Progressing   Problem: Activity: Goal: Risk for activity intolerance will decrease Outcome: Progressing   Problem: Cardiac: Goal: Will achieve and/or maintain hemodynamic stability Outcome: Progressing   Problem: Clinical Measurements: Goal: Postoperative complications will be avoided or minimized Outcome: Progressing   Problem: Respiratory: Goal: Respiratory status will improve Outcome: Progressing   Problem: Pain Management: Goal:  Pain level will decrease Outcome: Progressing   Problem: Skin Integrity: Goal: Wound healing without signs and symptoms infection will improve Outcome: Progressing

## 2018-10-18 NOTE — Discharge Summary (Addendum)
Physician Discharge Summary  Patient ID: Susan Elliott MRN: [id number] DOB/AGE: 22-Oct-1995 23 y.o.  Admit date: 10/17/2018 Discharge date: 10/24/2018  Admission Diagnoses: Endobronchial mass of bronchus intermedius  Patient Active Problem List   Diagnosis Date Noted  . Pressure injury of skin 10/18/2018   Elevated diaphragm 09/14/2018  . Right middle lobe pneumonia ([place]) 09/14/2018  . 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 Pressure injury of skin 10/18/2018  . S/P Right Middle Lobectomy, Right Lower Lobectomy 10/17/2018  . Elevated diaphragm 09/14/2018  . Right middle lobe pneumonia ([place]) 09/14/2018  . Chronic cough 08/09/2018   Discharged Condition: good  History of Present Illness:  Susan Elliott is a 23 yo white female with known history of Celiac Disease, Migraines and Asthma.  Her asthma developed a few years ago.  In July/August of this past year she developed a persistent cough.  She presented to the ED for a migraine headache.  CXR was obtained and showed evidence of pneumonia in the right lung.  She was treated with 3 rounds of antibiotics and steroids.  Her symptoms responded initially with treatment but would re-occur rapidly.  The patient was eventually referred to [facility] for evaluation.  She was seen by Dr. Loanne Drilling.  PFTs were obtained and showed a mild obstructive airway defect.  Follow up CXR in January showed persistent elevation of her right hemi-diagphragm and persistent right lung base opacity.  CT scan of the chest was obtained and showed an endobronchial mass lesion in the bronchus intermedius with associated middle and lower lobe atelectasis.  SNIFF testing showed normal movement of the diaphragm.  She underwent bronchoscopy was done 09/21/2018.  Biopsies were  obtained and were non-diagnostic.  She was referred to [facility] for surgical evaluation. Review of PET CT scan showed high level of uptake consistent with carcinoid tumor.  It was felt surgical resection would be indicated.  The risks and benefits of the procedure were explained to the patient and she was agreeable to proceed.    Hospital Course:   Susan Elliott presented to [facility] hospital on 10/17/2018.  She was taken to the operating room and underwent Video Bronchoscopy, Right VATS with Right Middle Lobectomy, Right Lower Lobectomy, Lymph Node Sampling, and Intercostal nerve block.  She tolerated the procedure without difficulty, was extubated, and taken to the recovery room in stable condition.  A line and foley were removed early in her post operative course. She did have a fair amount of incisional pain so Toradol was added in addition to her PCA and oral analgesics. Chest tube did not have an air leak and was placed to water seal on 02/20. Follow up chest x ray showed small right apical pneumothorax but again no air leak. Chest tube was removed on 02/21. Follow up chest x ray showed complete opacification of the right lung.  CXR remained unchanged overall.  She was taken back to the operating room on 10/22/2018.  She underwent Bronchoscopy with removal of mucous plug.  There was also edema of the mucosa and narrowing of the orifice.  She was tolerated the procedure without difficulty.  She was treated aggressively with mucomyst nebulizer treatments.  She continued to cough up mucous without difficulty.  Follow up CXR showed improvement. She was weaned to room air. She is tolerating a  diet. Her wounds are clean and dry and there are no signs of infection. She is felt surgically stable for discharge today.  Significant Diagnostic Studies: nuclear medicine:   1. Intense radiotracer activity associated with the endobronchial lesion in the RIGHT bronchus intermedius consistent with well differentiated  neuroendocrine tumor. 2. No evidence metastatic neuroendocrine tumor on skull base to thigh DOTATATE PET scan. 3. Post obstructive collapse of the RIGHT lower lobe increased from comparison exam.  PATHOLOGY:  Treatments: surgery:   PROCEDURES:  Bronchoscopy, right video-assisted thoracoscopy, right lower and middle bilobectomy, mediastinal lymph node dissection and intercostal nerve block.  Discharge Exam: Blood pressure (!) 146/79, pulse (!) 112, temperature 98 F (36.7 C), temperature source Oral, resp. rate (!) 22, height 5' 7"  (1.702 m), weight 104.3 kg, SpO2 98 %.  General appearance: alert, cooperative and no distress Heart: regular rate and rhythm Lungs: clear to auscultation bilaterally Abdomen: soft, non-tender; bowel sounds normal; no masses,  no organomegaly Extremities: extremities normal, atraumatic, no cyanosis or edema Wound: clean and dry  Disposition: Discharge disposition: 01-Home or Self Care     Home   Allergies as of 10/24/2018      Reactions   Penicillins Rash, Other (See Comments)   Did it involve swelling of the face/tongue/throat, SOB, or low BP? No Did it involve sudden or severe rash/hives, skin peeling, or any reaction on the inside of your mouth or nose? #  #  #  YES  #  #  #  Did you need to seek medical attention at a hospital or doctor's office? #  #  #  YES  #  #  #  When did it last happen?Childhood allergy   Gluten Meal Other (See Comments)   Celiac disease   Ultram [tramadol Hcl]    migraine      Medication List    STOP taking these medications   HYDROcodone-homatropine 5-1.5 MG/5ML syrup Commonly known as:  HYCODAN   naproxen 500 MG tablet Commonly known as:  NAPROSYN     TAKE these medications   acetaminophen 325 MG tablet Commonly known as:  TYLENOL Take 650 mg by mouth every 6 (six) hours as needed (for pain.).   albuterol 108 (90 Base) MCG/ACT inhaler Commonly known as:  PROVENTIL HFA;VENTOLIN HFA Inhale 2 puffs  into the lungs every 4 (four) hours as needed for wheezing or shortness of breath.   benzonatate 200 MG capsule Commonly known as:  TESSALON Take 1 capsule (200 mg total) by mouth 3 (three) times daily as needed for cough.   Biotin 5000 MCG Tabs Take 5,000 mcg by mouth daily.   cetirizine 10 MG tablet Commonly known as:  ZYRTEC Take 10 mg by mouth daily.   guaiFENesin 600 MG 12 hr tablet Commonly known as:  MUCINEX Take 2 tablets (1,200 mg total) by mouth 2 (two) times daily.   HYDROcodone-acetaminophen 5-325 MG tablet Commonly known as:  NORCO/VICODIN Take 1 tablet by mouth every 6 (six) hours as needed for severe pain.   ibuprofen 800 MG tablet Commonly known as:  ADVIL,MOTRIN Take 1 tablet (800 mg total) by mouth every 8 (eight) hours as needed for moderate pain.   multivitamin with minerals Tabs tablet Take 1 tablet by mouth daily.   Norethin Ace-Eth Estrad-FE 1-20 MG-MCG(24) Chew CHEW 1 TABLET DAILY What changed:    how much to take  how to take this  when to take this  additional instructions      Follow-up Information  Melrose Nakayama, MD Follow up on 10/29/2018.   Specialty:  Cardiothoracic Surgery Why:  Appointment is at 3:15, please get CXR at 2:45 at [facility] located on first floor of our office building Contact information: [place] [id number] [phone]           Signed:  Ellwood Handler PA-C 10/24/2018, 9:44 AM

## 2018-10-18 NOTE — Discharge Instructions (Signed)
Video-Assisted Thoracic Surgery, Care After °This sheet gives you information about how to care for yourself after your procedure. Your health care provider may also give you more specific instructions. If you have problems or questions, contact your health care provider. °What can I expect after the procedure? °After the procedure, it is common to have: °· Some pain and soreness in your chest. °· Pain when breathing in (inhaling) and coughing. °· Constipation. °· Fatigue. °· Difficulty sleeping. °Follow these instructions at home: °Preventing pneumonia °· Take deep breaths or do breathing exercises as instructed by your health care provider. Doing this helps prevent lung infection (pneumonia). °· Cough frequently. Coughing may cause discomfort, but it is important to clear mucus (phlegm) and expand your lungs. If it hurts to cough, hold a pillow against your chest or place the palms of both hands on top of the incision (use splinting) when you cough. This may help relieve discomfort. °· If you were given an incentive spirometer, use it as directed. An incentive spirometer is a tool that measures how well you are filling your lungs with each breath. °· Participate in pulmonary rehabilitation as directed by your health care provider. This is a program that combines education, exercise, and support from a team of specialists. The goal is to help you heal and get back to your normal activities as soon as possible. °Medicines °· Take over-the-counter or prescription medicines only as told by your health care provider. °· If you have pain, take pain-relieving medicine before your pain becomes severe. This is important because if your pain is under control, you will be able to breathe and cough more comfortably. °· If you were prescribed an antibiotic medicine, take it as told by your health care provider. Do not stop taking the antibiotic even if you start to feel better. °Activity °· Ask your health care provider what  activities are safe for you. °· Avoid activities that use your chest muscles for at least 3-4 weeks. °· Do not lift anything that is heavier than 10 lb (4.5 kg), or the limit that your health care provider tells you, until he or she says that it is safe. °Incision care °· Follow instructions from your health care provider about how to take care of your incision(s). Make sure you: °? Wash your hands with soap and water before you change your bandage (dressing). If soap and water are not available, use hand sanitizer. °? Change your dressing as told by your health care provider. °? Leave stitches (sutures), skin glue, or adhesive strips in place. These skin closures may need to stay in place for 2 weeks or longer. If adhesive strip edges start to loosen and curl up, you may trim the loose edges. Do not remove adhesive strips completely unless your health care provider tells you to do that. °· Keep your dressing dry until it has been removed. °· Check your incision area every day for signs of infection. Check for: °? Redness, swelling, or pain. °? Fluid or blood. °? Warmth. °? Pus or a bad smell. °Bathing °· Do not take baths, swim, or use a hot tub until your health care provider approves. You may take showers. °· After your dressing has been removed, use soap and water to gently wash your incision area. Do not use anything else to clean your incision(s) unless your health care provider tells you to do this. °Driving ° °· Do not drive until your health care provider approves. °· Do not drive or   use heavy machinery while taking prescription pain medicine. °Eating and drinking °· Eat a healthy, balanced diet as instructed by your health care provider. A healthy diet includes plenty of fresh fruits and vegetables, whole grains, and low-fat (lean) proteins. °· Limit foods that are high in fat and processed sugars, such as fried and sweet foods. °· Drink enough fluid to keep your urine clear or pale yellow. °General  instructions ° °· To prevent or treat constipation while you are taking prescription pain medicine, your health care provider may recommend that you: °? Take over-the-counter or prescription medicines. °? Eat foods that are high in fiber, such as beans, fresh fruits and vegetables, and whole grains. °· Do not use any products that contain nicotine or tobacco, such as cigarettes and e-cigarettes. If you need help quitting, ask your health care provider. °· Avoid secondhand smoke. °· Wear compression stockings as told by your health care provider. These stockings help to prevent blood clots and reduce swelling in your legs. °· If you have a chest tube, care for it as instructed by your health care provider. Do not travel by airplane during the 2 weeks after your chest tube is removed, or until your health care provider says that this is safe. °· Keep all follow-up visits as told by your health care provider. This is important. °Contact a health care provider if: °· You have redness, swelling, or pain around an incision. °· You have fluid or blood coming from an incision. °· Your incision area feels warm to the touch. °· You have pus or a bad smell coming from an incision. °· You have a fever or chills. °· You have nausea or vomiting. °· You have pain that does not get better with medicine. °Get help right away if: °· You have chest pain. °· Your heart is fluttering or beating rapidly. °· You develop a rash. °· You have shortness of breath or trouble breathing. °· You are confused. °· You have trouble speaking. °· You feel weak, light-headed, or dizzy. °· You faint. °Summary °· To help prevent lung infection (pneumonia), take deep breaths or do breathing exercises as instructed by your health care provider. °· Cough frequently to clear mucus (phlegm) and expand your lungs. If it hurts to cough, hold a pillow against your chest or place the palms of both hands on top of the incision (use splinting) when you cough. °· If  you have pain, take pain-relieving medicine before your pain becomes severe. This is important because if your pain is under control, you will be able to breathe and cough more comfortably. °· Ask your health care provider what activities are safe for you. °This information is not intended to replace advice given to you by your health care provider. Make sure you discuss any questions you have with your health care provider. °Document Released: 12/10/2012 Document Revised: 07/25/2016 Document Reviewed: 07/25/2016 °Elsevier Interactive Patient Education © 2019 [employer] ° °

## 2018-10-18 NOTE — Progress Notes (Signed)
Pt attempted to walk this morning. Limited by pain. Able to ambulate from the bed to the recliner in the room. Pt offered to ambulate this evening, politely declined due to pain. Given PRN oxycodone at 1830. Encouraged her to attempt ambulation or sitting edge of bed in 45 min - 1 hr when medication is in greatest effect.   Fritz Pickerel, RN

## 2018-10-18 NOTE — Op Note (Signed)
NAME: Susan Elliott, [place] MEDICAL RECORD [id number] ACCOUNT [id number] DATE OF BIRTH:Jan 25, 1996 FACILITY: [facility] LOCATION: [facility] PHYSICIAN: Chaya Jan, MD  OPERATIVE REPORT  DATE OF PROCEDURE:  10/17/2018  PREOPERATIVE DIAGNOSIS:  Right bronchus intermedius mass, probable carcinoid tumor.  POSTOPERATIVE DIAGNOSIS:  Right bronchus intermedius mass, probable carcinoid tumor.  PROCEDURES:   Bronchoscopy, Right video-assisted thoracoscopy, Right lower and middle bilobectomy, Mediastinal lymph node dissection and Intercostal nerve blocks 3rd through 9th interspaces.  SURGEON:  Modesto Charon, MD  ASSISTANTS:  Enid Cutter, PA-C and Ellwood Handler, PA-C  ANESTHESIA:  General.  FINDINGS:  Bronchoscopy showed a mass in the bronchus intermedius flush with the takeoff of the right upper lobe bronchus.  Intraoperative findings showed numerous markedly enlarged lymph nodes and total atelectasis of the lower and middle lobes.   Bronchial margin was free of tumor.  CLINICAL NOTE:  The patient is a 23 year old young woman who presented with recurrent infections and hemoptysis.  She was found to have an endobronchial mass in the bronchus intermedius.  Biopsy showed inflammation, but a PET dotatate scan was consistent  with a carcinoid tumor.  She was advised to undergo bronchoscopy and right video-assisted thoracoscopy with possible sleeve resection versus bilobectomy.  The indications, risks, benefits, and alternatives were discussed in detail with the patient.  She  understood and accepted the risks and agreed to proceed.  OPERATIVE NOTE:  The patient was brought to the operating room on 10/17/2018.  She had induction of general anesthesia and was intubated with a single lumen tube.  A timeout was performed.  Flexible fiberoptic bronchoscopy was performed via the  endotracheal tube.  There was a mass in the bronchus intermedius nearly flush with the right upper lobe bronchus.   This was very friable and bled easily.  It was involving the bronchial wall over a large portion of the circumfernce.  This was not suitable for endobronchial  management.  The bronchoscope was removed.  The patient was reintubated with a double lumen endotracheal tube.  She was placed in a left lateral decubitus position and the right chest was prepped and draped in the usual sterile fashion.  Single lung  ventilation of the left lung was initiated and was tolerated well throughout the procedure.  Another timeout was performed.  A solution containing 20 mL of liposomal bupivacaine, 30 mL of 0.5% bupivacaine and 50 mL of saline was used for local anesthesia at the incision sites as well as for the intercostal nerve blocks.  A site was injected in  the seventh interspace in the mid axillary line.  A small port incision was made and the thoracoscope was advanced into the chest.  There was complete atelectasis of the lower and middle lobes.  There was a moderate pleural effusion.  This was evacuated  and sent for cytology and cultures.  The fluid appeared benign and inflammatory in nature.  An area was selected for the incision.  This area was anesthetized with the bupivacaine solution and a 5 cm working incision was made.  No rib spreading was  performed during the procedure.  A second port incision was made anterior to the first later in the procedure for placement of the staplers with division of the vascular structures.  Intercostal nerve blocks were then performed from the third to the  ninth interspaces. A needle was inserted from a posterior approach and the injection was made into a subpleural plane.  Eight to 10 mL of this solution was injected into each interspace.  The inferior ligament was divided with the Harmonic scalpel  and then the pleural reflection was divided posteriorly.  Multiple, markedly enlarged pale lymph nodes were seen in the subcarinal region and along the esophagus.  The nodes  were excised.  Two of the larger  nodes were sent for frozen section.  The remainder were sent for permanent pathology.  The frozens returned with no tumor seen.  The lung was densely adherent around the bronchus.  The pleural reflection was divided at the hilum anteriorly and then medially.  Inspection of the fissure revealed  no visible fissure due to the inflammation.  Due to the position of the tumor and the severe inflammatory change, it was not felt that a sleeve resection would be feasible.  The right middle lobe vein was identified.  It was encircled and divided with  the vascular stapler.  The minor fissure was completed with sequential firings of an Echelon 45 mm stapler using gold cartridges.  The medial pulmonary artery branch was encountered and preserved with the division of the fissure.  As the artery was  encountered, multiple enlarged nodes were again encountered and this made the dissection very difficult.  As a node was being dissected off the medial pulmonary arterial branch to the middle lobe, a small branch was avulsed and there was bleeding.  This  was controlled with direct pressure and ultimately stopped.  The middle lobe arterial branch was divided with the vascular stapler.  The fissure then was completed, keeping dissection lateral to the pulmonary artery.  The posterior descending and  superior segmental branches were identified and preserved.  The inferior pulmonary vein was divided.  The lower lobe pulmonary artery then was dissected out and divided with the vascular stapler as well.  The bronchus was divided with a 15 blade scalpel  at the level of the takeoff of the right upper lobe.  The tumor was in close proximity along the membranous bronchus in the medial portion of the bronchus.  The lower and middle lobes were placed into an endoscopic retrieval bag, removed and sent for  frozen section on the mass.  An additional sliver of tissue was removed from the bronchus along the  membranous bronchus.  This was sent for frozen section as the final bronchial margin, which came back free of tumor.  The bronchus then was closed with  interrupted 3-0 PDS sutures.  At the completion of the bronchial closure.  The test inflation showed some resistance, although the lung did inflate.  An inspection was made with the bronchoscope and, there was significant edema in the area and the lumen  was not clearly visible.  The decision was made to take down the closure and redo it.  Once again the closure was performed with 3-0 PDS sutures.  The bronchoscope was advanced into the right upper lobe bronchus  during the closure.  After the closure was completed, the bronchoscope was pulled back.  There was some narrowing and a lot of edema in the mucosa, but the bronchoscope did pass into the right upper lobe segmental bronchi.  A test inflation showed some  leakage from the bronchial closure.  This area was repaired with a 3-0 PDS suture.  After completion of the closure, bronchoscopy was repeated and the bronchoscope did pass into the right upper lobe airway.  The chest was copiously irrigated with warm  saline.  A test inflation at 30 cm of water revealed no air leakage.  A 28 French chest  tube was placed through the anterior port incision and secured with #1 silk suture.  The posterior port incision was closed after removing the trocar.  The working  incision was closed in 3 layers.  The chest tube was placed to suction.    The patient was extubated in the operating room and taken to the [facility] in good condition.    All sponge, needle and instrument counts were correct at the end of the procedure.  AN/NUANCE  D:10/17/2018 T:10/18/2018 JOB:[id number]

## 2018-10-19 ENCOUNTER — Inpatient Hospital Stay (HOSPITAL_COMMUNITY): Payer: 59

## 2018-10-19 LAB — COMPREHENSIVE METABOLIC PANEL
ALT: 24 U/L (ref 0–44)
AST: 30 U/L (ref 15–41)
Albumin: 2.4 g/dL — ABNORMAL LOW (ref 3.5–5.0)
Alkaline Phosphatase: 44 U/L (ref 38–126)
Anion gap: 4 — ABNORMAL LOW (ref 5–15)
BUN: <5 mg/dL — ABNORMAL LOW (ref 6–20)
CHLORIDE: 103 mmol/L (ref 98–111)
CO2: 28 mmol/L (ref 22–32)
Calcium: 8.1 mg/dL — ABNORMAL LOW (ref 8.9–10.3)
Creatinine, Ser: 0.73 mg/dL (ref 0.44–1.00)
GFR calc Af Amer: >60 mL/min (ref >60)
GFR calc non Af Amer: >60 mL/min (ref >60)
Glucose, Bld: 106 mg/dL — ABNORMAL HIGH (ref 70–99)
Potassium: 4 mmol/L (ref 3.5–5.1)
Sodium: 135 mmol/L (ref 135–145)
Total Bilirubin: 0.3 mg/dL (ref 0.3–1.2)
Total Protein: 5.4 g/dL — ABNORMAL LOW (ref 6.5–8.1)

## 2018-10-19 LAB — CBC
HCT: 31.6 % — ABNORMAL LOW (ref 36.0–46.0)
Hemoglobin: 10.2 g/dL — ABNORMAL LOW (ref 12.0–15.0)
MCH: 29 pg (ref 26.0–34.0)
MCHC: 32.3 g/dL (ref 30.0–36.0)
MCV: 89.8 fL (ref 80.0–100.0)
PLATELETS: 266 10*3/uL (ref 150–400)
RBC: 3.52 MIL/uL — ABNORMAL LOW (ref 3.87–5.11)
RDW: 13.9 % (ref 11.5–15.5)
WBC: 13.5 10*3/uL — ABNORMAL HIGH (ref 4.0–10.5)
nRBC: 0.1 % (ref 0.0–0.2)

## 2018-10-19 MED ORDER — HYDROCODONE-ACETAMINOPHEN 5-325 MG PO TABS
1.0000 | ORAL_TABLET | Freq: Four times a day (QID) | ORAL | Status: DC | PRN
  Administered 2018-10-19 – 2018-10-24 (×11): 1 via ORAL
  Filled 2018-10-19 (×11): qty 1

## 2018-10-19 MED ORDER — GUAIFENESIN ER 600 MG PO TB12
600.0000 mg | ORAL_TABLET | Freq: Two times a day (BID) | ORAL | Status: DC
  Administered 2018-10-19 – 2018-10-22 (×7): 600 mg via ORAL
  Filled 2018-10-19 (×7): qty 1

## 2018-10-19 MED ORDER — KETOROLAC TROMETHAMINE 15 MG/ML IJ SOLN
15.0000 mg | Freq: Once | INTRAMUSCULAR | Status: AC
  Administered 2018-10-19: 15 mg via INTRAVENOUS
  Filled 2018-10-19: qty 1

## 2018-10-19 MED ORDER — DEXAMETHASONE SODIUM PHOSPHATE 10 MG/ML IJ SOLN
INTRAMUSCULAR | Status: AC
  Administered 2018-10-19: 10 mg via INTRAVENOUS
  Filled 2018-10-19: qty 1

## 2018-10-19 MED ORDER — DIPHENHYDRAMINE HCL 50 MG/ML IJ SOLN
12.5000 mg | Freq: Once | INTRAMUSCULAR | Status: AC
  Administered 2018-10-19: 12.5 mg via INTRAVENOUS
  Filled 2018-10-19: qty 1

## 2018-10-19 MED ORDER — METOCLOPRAMIDE HCL 5 MG/ML IJ SOLN
10.0000 mg | Freq: Once | INTRAMUSCULAR | Status: AC
  Administered 2018-10-19: 10 mg via INTRAVENOUS
  Filled 2018-10-19: qty 2

## 2018-10-19 MED ORDER — DEXAMETHASONE SODIUM PHOSPHATE 10 MG/ML IJ SOLN
10.0000 mg | Freq: Once | INTRAMUSCULAR | Status: AC
  Administered 2018-10-19: 10 mg via INTRAVENOUS

## 2018-10-19 NOTE — Progress Notes (Signed)
Notified Lars Pinks PA and Dr. Roxan Hockey to view chest xray results. Will continue to monitor.

## 2018-10-19 NOTE — Plan of Care (Signed)
  Problem: Health Behavior/Discharge Planning: Goal: Ability to manage health-related needs will improve Outcome: Progressing   Problem: Clinical Measurements: Goal: Ability to maintain clinical measurements within normal limits will improve Outcome: Progressing Goal: Will remain free from infection Outcome: Progressing Goal: Diagnostic test results will improve Outcome: Progressing Goal: Respiratory complications will improve Outcome: Progressing Goal: Cardiovascular complication will be avoided Outcome: Progressing   Problem: Activity: Goal: Risk for activity intolerance will decrease Outcome: Progressing   Problem: Nutrition: Goal: Adequate nutrition will be maintained Outcome: Progressing   Problem: Coping: Goal: Level of anxiety will decrease Outcome: Progressing   Problem: Elimination: Goal: Will not experience complications related to bowel motility Outcome: Progressing Goal: Will not experience complications related to urinary retention Outcome: Progressing   Problem: Pain Managment: Goal: General experience of comfort will improve Outcome: Progressing   Problem: Safety: Goal: Ability to remain free from injury will improve Outcome: Progressing   Problem: Skin Integrity: Goal: Risk for impaired skin integrity will decrease Outcome: Progressing   Problem: Education: Goal: Knowledge of disease or condition will improve Outcome: Progressing Goal: Knowledge of the prescribed therapeutic regimen will improve Outcome: Progressing   Problem: Activity: Goal: Risk for activity intolerance will decrease Outcome: Progressing   Problem: Cardiac: Goal: Will achieve and/or maintain hemodynamic stability Outcome: Progressing   Problem: Clinical Measurements: Goal: Postoperative complications will be avoided or minimized Outcome: Progressing   Problem: Respiratory: Goal: Respiratory status will improve Outcome: Progressing   Problem: Pain Management: Goal:  Pain level will decrease Outcome: Progressing   Problem: Skin Integrity: Goal: Wound healing without signs and symptoms infection will improve Outcome: Progressing

## 2018-10-19 NOTE — Progress Notes (Addendum)
      [address]       [place] [address]             [phone]       2 Days Post-Op Procedure(s) (LRB): VIDEO BRONCHOSCOPY (N/A) VIDEO ASSISTED THORACOSCOPY with medial lymph node dissection and intercostal nerve block (Right) Right middle and lower  BILOBECTOMY (N/A)  Subjective: Patient thinks Toradol has helped with incisional pain. She had a little nausea earlier this am. She is eating breakfast.  Objective: Vital signs in last 24 hours: Temp:  [97.6 F (36.4 C)-99.5 F (37.5 C)] 98.2 F (36.8 C) (02/21 0528) Pulse Rate:  [119-137] 126 (02/21 0528) Cardiac Rhythm: Sinus tachycardia (02/21 0423) Resp:  [18-26] 22 (02/21 0528) BP: (133-152)/(64-82) 133/69 (02/21 0528) SpO2:  [95 %-100 %] 97 % (02/21 0730)     Intake/Output from previous day: 02/20 0701 - 02/21 0700 In: 988 [P.O.:240; I.V.:748] Out: 2600 [Urine:2450; Chest Tube:150]   Physical Exam:  Cardiovascular: Tachycardia Pulmonary: Diminished right basilar breath sounds Abdomen: Soft, non tender, bowel sounds present. Extremities: Trace bilateral lower extremity edema. Wounds: Dressing is clean and dry.   Chest Tube: to water seal, no air leak  Lab Results: CBC: Recent Labs    10/18/18 0446 10/19/18 0437  WBC 15.9* 13.5*  HGB 11.4* 10.2*  HCT 35.5* 31.6*  PLT 341 266   BMET:  Recent Labs    10/18/18 0446 10/19/18 0437  NA 136 135  K 4.2 4.0  CL 105 103  CO2 20* 28  GLUCOSE 144* 106*  BUN 5* <5*  CREATININE 0.71 0.73  CALCIUM 8.2* 8.1*    PT/INR:  Recent Labs    10/16/18 1328  LABPROT 13.1  INR 1.00   ABG:  INR: Will add last result for INR, ABG once components are confirmed Will add last 4 CBG results once components are confirmed  Assessment/Plan:  1. CV - Tachycardia, likely related to pain. 2.  Pulmonary - On 3 liters of oxygen via [place]. Chest tube with 150 cc output last 24 hours. Chest tube is to water seal and there is no air leak. CXR this am  shows patient is rotated, small right apical pneumothorax and pleural effusion. Will remove chest tube.Mucinex for cough. Encourage incentive spirometer and flutter valve 3. Expected anemia-H and H this am 10.2 and 31.6  Donielle M ZimmermanPA-C 10/19/2018,7:48 AM [phone]  Patient seen and examined, agree with above No air leak, minimal drainage- dc chest tube Having trouble clearing secretions- will add mucinex Continue bronchodilators Consider adding pulmicort short term- will hold off for now with handsewn bronchial closure  Remo Lipps C. Roxan Hockey, MD [facility] [phone]

## 2018-10-20 ENCOUNTER — Inpatient Hospital Stay (HOSPITAL_COMMUNITY): Payer: 59

## 2018-10-20 MED ORDER — LOPERAMIDE HCL 2 MG PO CAPS
4.0000 mg | ORAL_CAPSULE | Freq: Once | ORAL | Status: AC
  Administered 2018-10-20: 4 mg via ORAL
  Filled 2018-10-20: qty 2

## 2018-10-20 MED ORDER — WHITE PETROLATUM EX OINT
TOPICAL_OINTMENT | CUTANEOUS | Status: AC
  Administered 2018-10-20: 02:00:00
  Filled 2018-10-20: qty 28.35

## 2018-10-20 MED ORDER — ACETYLCYSTEINE 20 % IN SOLN
2.0000 mL | Freq: Three times a day (TID) | RESPIRATORY_TRACT | Status: AC
  Administered 2018-10-20: 4 mL via RESPIRATORY_TRACT
  Administered 2018-10-20: 2 mL via RESPIRATORY_TRACT
  Filled 2018-10-20 (×3): qty 4

## 2018-10-20 NOTE — Progress Notes (Addendum)
3 Days Post-Op Procedure(s) (LRB): VIDEO BRONCHOSCOPY (N/A) VIDEO ASSISTED THORACOSCOPY with medial lymph node dissection and intercostal nerve block (Right) Right middle and lower  BILOBECTOMY (N/A) Subjective: Feels better today  Objective: Vital signs in last 24 hours: Temp:  [97.6 F (36.4 C)-98.8 F (37.1 C)] 97.8 F (36.6 C) (02/22 0730) Pulse Rate:  [104-138] 104 (02/22 0615) Cardiac Rhythm: Sinus tachycardia (02/22 0751) Resp:  [11-27] 11 (02/22 0615) BP: (130-151)/(67-99) 140/72 (02/22 0730) SpO2:  [93 %-100 %] 97 % (02/22 0730)  Hemodynamic parameters for last 24 hours:    Intake/Output from previous day: 02/21 0701 - 02/22 0700 In: 859.9 [P.O.:720; I.V.:139.9] Out: 2160 [Urine:2150; Chest Tube:10] Intake/Output this shift: No intake/output data recorded.  General appearance: alert, cooperative and no distress Neurologic: intact Heart: tachy,regular Lungs: diminished breath sounds on right Wound: clean and drty  Lab Results: Recent Labs    10/18/18 0446 10/19/18 0437  WBC 15.9* 13.5*  HGB 11.4* 10.2*  HCT 35.5* 31.6*  PLT 341 266   BMET:  Recent Labs    10/18/18 0446 10/19/18 0437  NA 136 135  K 4.2 4.0  CL 105 103  CO2 20* 28  GLUCOSE 144* 106*  BUN 5* <5*  CREATININE 0.71 0.73  CALCIUM 8.2* 8.1*    PT/INR: No results for input(s): LABPROT, INR in the last 72 hours. ABG    Component Value Date/Time   PHART 7.408 10/18/2018 0452   HCO3 23.8 10/18/2018 0452   ACIDBASEDEF 0.2 10/18/2018 0452   O2SAT 98.6 10/18/2018 0452   CBG (last 3)  No results for input(s): GLUCAP in the last 72 hours.  Assessment/Plan: S/P Procedure(s) (LRB): VIDEO BRONCHOSCOPY (N/A) VIDEO ASSISTED THORACOSCOPY with medial lymph node dissection and intercostal nerve block (Right) Right middle and lower  BILOBECTOMY (N/A) -Feels better Pain well controlled CXR shows atelectasis of RUL- will add mucomyst for 24 hours If not improved over next 24-48 hours may  need bronch Anemia secondary to acute blood loss  LOS: 3 days    Susan Elliott 10/20/2018

## 2018-10-20 NOTE — Progress Notes (Signed)
10 cc of morphine pca wasted in stericycle with Tammy P RN

## 2018-10-21 ENCOUNTER — Inpatient Hospital Stay (HOSPITAL_COMMUNITY): Payer: 59

## 2018-10-21 MED ORDER — ACETYLCYSTEINE 20 % IN SOLN
3.0000 mL | Freq: Four times a day (QID) | RESPIRATORY_TRACT | Status: DC
  Administered 2018-10-21: 4 mL via RESPIRATORY_TRACT
  Administered 2018-10-21 – 2018-10-22 (×3): 3 mL via RESPIRATORY_TRACT
  Filled 2018-10-21 (×5): qty 4

## 2018-10-21 MED ORDER — ONDANSETRON HCL 4 MG PO TABS
4.0000 mg | ORAL_TABLET | Freq: Three times a day (TID) | ORAL | Status: DC | PRN
  Administered 2018-10-21 – 2018-10-24 (×2): 4 mg via ORAL
  Filled 2018-10-21 (×2): qty 1

## 2018-10-21 MED ORDER — ENSURE ENLIVE PO LIQD: 237.0000 mL | Freq: Three times a day (TID) | ORAL | Status: DC

## 2018-10-21 MED ORDER — KETOROLAC TROMETHAMINE 15 MG/ML IJ SOLN
15.0000 mg | Freq: Four times a day (QID) | INTRAMUSCULAR | Status: AC
  Administered 2018-10-21 – 2018-10-22 (×4): 15 mg via INTRAVENOUS
  Filled 2018-10-21 (×4): qty 1

## 2018-10-21 NOTE — Progress Notes (Addendum)
      [address]       [place] [address]             [phone]      4 Days Post-Op Procedure(s) (LRB): VIDEO BRONCHOSCOPY (N/A) VIDEO ASSISTED THORACOSCOPY with medial lymph node dissection and intercostal nerve block (Right) Right middle and lower  BILOBECTOMY (N/A) Subjective: Back pain from the bed, concerned about oral ulcers present since surgery, appetite is still no back.   Objective: Vital signs in last 24 hours: Temp:  [98.1 F (36.7 C)-98.8 F (37.1 C)] 98.3 F (36.8 C) (02/23 1056) Pulse Rate:  [108-133] 123 (02/23 1056) Cardiac Rhythm: Sinus tachycardia (02/23 0700) Resp:  [21-39] 27 (02/23 1056) BP: (136-151)/(63-95) 136/63 (02/23 1056) SpO2:  [97 %-99 %] 99 % (02/23 1056)     Intake/Output from previous day: 02/22 0701 - 02/23 0700 In: 720 [P.O.:720] Out: -  Intake/Output this shift: Total I/O In: 240 [P.O.:240] Out: 0   General appearance: alert, cooperative and no distress Heart: sinus tachycardia Lungs: clear to auscultation bilaterally and diminished in the right lower lobe Abdomen: soft, non-tender; bowel sounds normal; no masses,  no organomegaly Extremities: extremities normal, atraumatic, no cyanosis or edema Wound: clean and dry  Lab Results: Recent Labs    10/19/18 0437  WBC 13.5*  HGB 10.2*  HCT 31.6*  PLT 266   BMET:  Recent Labs    10/19/18 0437  NA 135  K 4.0  CL 103  CO2 28  GLUCOSE 106*  BUN <5*  CREATININE 0.73  CALCIUM 8.1*    PT/INR: No results for input(s): LABPROT, INR in the last 72 hours. ABG    Component Value Date/Time   PHART 7.408 10/18/2018 0452   HCO3 23.8 10/18/2018 0452   ACIDBASEDEF 0.2 10/18/2018 0452   O2SAT 98.6 10/18/2018 0452   CBG (last 3)  No results for input(s): GLUCAP in the last 72 hours.  Assessment/Plan: S/P Procedure(s) (LRB): VIDEO BRONCHOSCOPY (N/A) VIDEO ASSISTED THORACOSCOPY with medial lymph node dissection and intercostal nerve block (Right) Right  middle and lower  BILOBECTOMY (N/A)  1. CV-sinus tachycardia 2. Pulm- CXR shows Decreased gas in the right apex which may represent pneumothorax. Improved aeration in the left lung base. Hopefully won't need bronch. Tolerating room air with good oxygen saturation 3. No recent labs. Stable creatinine, H and H stable.  4. Ordered Ensure  5. Oral ulcers-patient's mom brought in oral gel for mouth ulcers  Plan: Some SOB with walking, otherwise she feels like her breathing is improving. Continue PRN and scheduled nebs. Will discuss CXR with Dr. Cyndia Bent.   LOS: 4 days    Elgie Collard 10/21/2018  Chart reviewed, patient examined, agree with above. She feels ok overall. Not coughing up any sputum. She is not short of breath and easily able to carry on conversation. CXR continues to show opacification of right upper lobe. There is a small amount of air at apex but unclear if that is in lung or pleural space.  Suspect bronchial obstruction due to mucous. Encouraged to continue IS. Will add Mucomyst to nebs. Check CXR in am and if no improvement she will need bronch. Keep NPO after MN.

## 2018-10-22 ENCOUNTER — Inpatient Hospital Stay (HOSPITAL_COMMUNITY): Payer: 59

## 2018-10-22 ENCOUNTER — Inpatient Hospital Stay (HOSPITAL_COMMUNITY): Payer: 59 | Admitting: Registered Nurse

## 2018-10-22 ENCOUNTER — Encounter (HOSPITAL_COMMUNITY)
Admission: RE | Disposition: A | Discharge status: Home / Self Care | Payer: Self-pay | Source: Home / Self Care | Attending: Thoracic Surgery (Cardiothoracic Vascular Surgery)

## 2018-10-22 DIAGNOSIS — J9811 Atelectasis: Secondary | ICD-10-CM

## 2018-10-22 HISTORY — PX: VIDEO BRONCHOSCOPY: SHX5072

## 2018-10-22 LAB — CULTURE, BODY FLUID W GRAM STAIN -BOTTLE: Culture: NO GROWTH

## 2018-10-22 SURGERY — BRONCHOSCOPY, VIDEO-ASSISTED
Anesthesia: General

## 2018-10-22 MED ORDER — KETOROLAC TROMETHAMINE 30 MG/ML IJ SOLN
INTRAMUSCULAR | Status: AC
  Filled 2018-10-22: qty 1

## 2018-10-22 MED ORDER — PROPOFOL 10 MG/ML IV BOLUS
INTRAVENOUS | Status: AC
  Filled 2018-10-22: qty 20

## 2018-10-22 MED ORDER — FENTANYL CITRATE (PF) 100 MCG/2ML IJ SOLN
25.0000 ug | INTRAMUSCULAR | Status: DC | PRN
  Administered 2018-10-22 (×2): 50 ug via INTRAVENOUS

## 2018-10-22 MED ORDER — FENTANYL CITRATE (PF) 100 MCG/2ML IJ SOLN
INTRAMUSCULAR | Status: AC
  Administered 2018-10-22: 50 ug via INTRAVENOUS
  Filled 2018-10-22: qty 2

## 2018-10-22 MED ORDER — DIPHENHYDRAMINE HCL 50 MG/ML IJ SOLN
12.5000 mg | Freq: Once | INTRAMUSCULAR | Status: AC
  Administered 2018-10-22: 12.5 mg via INTRAVENOUS

## 2018-10-22 MED ORDER — DEXAMETHASONE SODIUM PHOSPHATE 10 MG/ML IJ SOLN
INTRAMUSCULAR | Status: DC | PRN
  Administered 2018-10-22: 10 mg via INTRAVENOUS

## 2018-10-22 MED ORDER — ROCURONIUM BROMIDE 50 MG/5ML IV SOSY
PREFILLED_SYRINGE | INTRAVENOUS | Status: AC
  Filled 2018-10-22: qty 10

## 2018-10-22 MED ORDER — PROPOFOL 10 MG/ML IV BOLUS
INTRAVENOUS | Status: DC | PRN
  Administered 2018-10-22: 70 mg via INTRAVENOUS
  Administered 2018-10-22: 200 mg via INTRAVENOUS

## 2018-10-22 MED ORDER — MIDAZOLAM HCL 5 MG/5ML IJ SOLN
INTRAMUSCULAR | Status: DC | PRN
  Administered 2018-10-22: 2 mg via INTRAVENOUS

## 2018-10-22 MED ORDER — ACETAMINOPHEN 10 MG/ML IV SOLN
1000.0000 mg | Freq: Once | INTRAVENOUS | Status: AC
  Administered 2018-10-22: 1000 mg via INTRAVENOUS

## 2018-10-22 MED ORDER — EPINEPHRINE PF 1 MG/ML IJ SOLN
INTRAMUSCULAR | Status: AC
  Filled 2018-10-22: qty 1

## 2018-10-22 MED ORDER — LACTATED RINGERS IV SOLN
INTRAVENOUS | Status: DC
  Administered 2018-10-22: 14:00:00 via INTRAVENOUS

## 2018-10-22 MED ORDER — PROMETHAZINE HCL 25 MG/ML IJ SOLN: 6.2500 mg | INTRAMUSCULAR | Status: DC | PRN

## 2018-10-22 MED ORDER — FENTANYL CITRATE (PF) 100 MCG/2ML IJ SOLN
INTRAMUSCULAR | Status: DC | PRN
  Administered 2018-10-22: 100 ug via INTRAVENOUS

## 2018-10-22 MED ORDER — 0.9 % SODIUM CHLORIDE (POUR BTL) OPTIME
TOPICAL | Status: DC | PRN
  Administered 2018-10-22: 1000 mL

## 2018-10-22 MED ORDER — DEXAMETHASONE SODIUM PHOSPHATE 10 MG/ML IJ SOLN
INTRAMUSCULAR | Status: AC
  Filled 2018-10-22: qty 1

## 2018-10-22 MED ORDER — KETOROLAC TROMETHAMINE 30 MG/ML IJ SOLN
30.0000 mg | Freq: Once | INTRAMUSCULAR | Status: AC
  Administered 2018-10-22: 30 mg via INTRAVENOUS

## 2018-10-22 MED ORDER — FENTANYL CITRATE (PF) 250 MCG/5ML IJ SOLN
INTRAMUSCULAR | Status: AC
  Filled 2018-10-22: qty 5

## 2018-10-22 MED ORDER — LIDOCAINE 2% (20 MG/ML) 5 ML SYRINGE
INTRAMUSCULAR | Status: DC | PRN
  Administered 2018-10-22: 80 mg via INTRAVENOUS

## 2018-10-22 MED ORDER — DIPHENHYDRAMINE HCL 50 MG/ML IJ SOLN
INTRAMUSCULAR | Status: AC
  Administered 2018-10-22: 12.5 mg via INTRAVENOUS
  Filled 2018-10-22: qty 1

## 2018-10-22 MED ORDER — ROCURONIUM BROMIDE 50 MG/5ML IV SOSY
PREFILLED_SYRINGE | INTRAVENOUS | Status: DC | PRN
  Administered 2018-10-22: 30 mg via INTRAVENOUS

## 2018-10-22 MED ORDER — ACETAMINOPHEN 10 MG/ML IV SOLN
INTRAVENOUS | Status: AC
  Filled 2018-10-22: qty 100

## 2018-10-22 MED ORDER — SALINE SPRAY 0.65 % NA SOLN
1.0000 | NASAL | Status: DC | PRN
  Administered 2018-10-23: 1 via NASAL
  Filled 2018-10-22 (×3): qty 44

## 2018-10-22 MED ORDER — LIDOCAINE 2% (20 MG/ML) 5 ML SYRINGE
INTRAMUSCULAR | Status: AC
  Filled 2018-10-22: qty 5

## 2018-10-22 MED ORDER — ONDANSETRON HCL 4 MG/2ML IJ SOLN
INTRAMUSCULAR | Status: AC
  Filled 2018-10-22: qty 2

## 2018-10-22 MED ORDER — ROCURONIUM BROMIDE 50 MG/5ML IV SOSY
PREFILLED_SYRINGE | INTRAVENOUS | Status: AC
  Filled 2018-10-22: qty 5

## 2018-10-22 MED ORDER — SUGAMMADEX SODIUM 200 MG/2ML IV SOLN
INTRAVENOUS | Status: DC | PRN
  Administered 2018-10-22 (×4): 100 mg via INTRAVENOUS

## 2018-10-22 MED ORDER — ACETYLCYSTEINE 20 % IN SOLN
3.0000 mL | Freq: Four times a day (QID) | RESPIRATORY_TRACT | Status: DC
  Administered 2018-10-22 – 2018-10-24 (×6): 3 mL via RESPIRATORY_TRACT
  Filled 2018-10-22 (×7): qty 4

## 2018-10-22 MED ORDER — MIDAZOLAM HCL 2 MG/2ML IJ SOLN
INTRAMUSCULAR | Status: AC
  Filled 2018-10-22: qty 2

## 2018-10-22 MED ORDER — OXYCODONE HCL 5 MG/5ML PO SOLN: 5.0000 mg | Freq: Once | ORAL | Status: DC | PRN

## 2018-10-22 MED ORDER — ONDANSETRON HCL 4 MG/2ML IJ SOLN
INTRAMUSCULAR | Status: DC | PRN
  Administered 2018-10-22: 4 mg via INTRAVENOUS

## 2018-10-22 MED ORDER — OXYCODONE HCL 5 MG PO TABS: 5.0000 mg | ORAL_TABLET | Freq: Once | ORAL | Status: DC | PRN

## 2018-10-22 SURGICAL SUPPLY — 36 items
ADAPTER VALVE BIOPSY EBUS (MISCELLANEOUS) IMPLANT
ADPTR VALVE BIOPSY EBUS (MISCELLANEOUS)
BRUSH CYTOL CELLEBRITY 1.5X140 (MISCELLANEOUS) IMPLANT
CANISTER SUCT 3000ML PPV (MISCELLANEOUS) ×3 IMPLANT
CONT SPEC 4OZ CLIKSEAL STRL BL (MISCELLANEOUS) ×6 IMPLANT
COVER BACK TABLE 60X90IN (DRAPES) ×3 IMPLANT
COVER WAND RF STERILE (DRAPES) ×3 IMPLANT
FILTER STRAW FLUID ASPIR (MISCELLANEOUS) IMPLANT
FORCEPS BIOP RJ4 1.8 (CUTTING FORCEPS) IMPLANT
FORCEPS RADIAL JAW LRG 4 PULM (INSTRUMENTS) IMPLANT
GAUZE SPONGE 4X4 12PLY STRL (GAUZE/BANDAGES/DRESSINGS) ×3 IMPLANT
GLOVE SURG SIGNA 7.5 PF LTX (GLOVE) ×3 IMPLANT
GOWN STRL REUS W/ TWL LRG LVL3 (GOWN DISPOSABLE) ×1 IMPLANT
GOWN STRL REUS W/ TWL XL LVL3 (GOWN DISPOSABLE) ×1 IMPLANT
GOWN STRL REUS W/TWL LRG LVL3 (GOWN DISPOSABLE) ×3
GOWN STRL REUS W/TWL XL LVL3 (GOWN DISPOSABLE) ×3
KIT CLEAN ENDO COMPLIANCE (KITS) ×3 IMPLANT
KIT TURNOVER KIT B (KITS) ×3 IMPLANT
MARKER SKIN DUAL TIP RULER LAB (MISCELLANEOUS) ×3 IMPLANT
NS IRRIG 1000ML POUR BTL (IV SOLUTION) ×3 IMPLANT
OIL SILICONE PENTAX (PARTS (SERVICE/REPAIRS)) ×3 IMPLANT
PAD ARMBOARD 7.5X6 YLW CONV (MISCELLANEOUS) ×6 IMPLANT
RADIAL JAW LRG 4 PULMONARY (INSTRUMENTS) ×2
SYR 20ML ECCENTRIC (SYRINGE) ×6 IMPLANT
SYR 5ML LL (SYRINGE) ×3 IMPLANT
SYR 5ML LUER SLIP (SYRINGE) ×3 IMPLANT
TOWEL GREEN STERILE (TOWEL DISPOSABLE) ×3 IMPLANT
TOWEL GREEN STERILE FF (TOWEL DISPOSABLE) ×3 IMPLANT
TRAP SPECIMEN MUCOUS 40CC (MISCELLANEOUS) ×3 IMPLANT
TUBE CONNECTING 12'X1/4 (SUCTIONS) ×1
TUBE CONNECTING 12X1/4 (SUCTIONS) ×1 IMPLANT
TUBE CONNECTING 20'X1/4 (TUBING) ×1
TUBE CONNECTING 20X1/4 (TUBING) ×2 IMPLANT
VALVE BIOPSY  SINGLE USE (MISCELLANEOUS) ×8
VALVE BIOPSY SINGLE USE (MISCELLANEOUS) ×1 IMPLANT
VALVE SUCTION BRONCHIO DISP (MISCELLANEOUS) ×3 IMPLANT

## 2018-10-22 NOTE — Anesthesia Preprocedure Evaluation (Addendum)
Anesthesia Evaluation  Patient identified by MRN, date of birth, ID band Patient awake    Reviewed: Allergy & Precautions, NPO status , Patient's Chart, lab work & pertinent test results  History of Anesthesia Complications (+) history of anesthetic complications (migraines post anesthetic)  Airway Mallampati: II  TM Distance: >3 FB Neck ROM: Full    Dental  (+) Dental Advisory Given, Teeth Intact   Pulmonary asthma ,   Suspected carcinoid tumor s/p recent excision and right middle & lower lobectomy   breath sounds clear to auscultation + decreased breath sounds (diminished on right lung fields)      Cardiovascular negative cardio ROS   Rhythm:Regular Rate:Tachycardia     Neuro/Psych  Headaches, negative psych ROS   GI/Hepatic Neg liver ROS,  Celiac disease    Endo/Other   Obesity   Renal/GU negative Renal ROS     Musculoskeletal negative musculoskeletal ROS (+)   Abdominal   Peds  Hematology negative hematology ROS (+)   Anesthesia Other Findings   Reproductive/Obstetrics                            Anesthesia Physical Anesthesia Plan  ASA: II  Anesthesia Plan: General   Post-op Pain Management:    Induction: Intravenous  PONV Risk Score and Plan: 3 and Treatment may vary due to age or medical condition, Ondansetron, Dexamethasone and Midazolam  Airway Management Planned: Oral ETT  Additional Equipment: None  Intra-op Plan:   Post-operative Plan: Possible Post-op intubation/ventilation  Informed Consent: I have reviewed the patients History and Physical, chart, labs and discussed the procedure including the risks, benefits and alternatives for the proposed anesthesia with the patient or authorized representative who has indicated his/her understanding and acceptance.     Dental advisory given  Plan Discussed with: CRNA and Anesthesiologist  Anesthesia Plan  Comments:        Anesthesia Quick Evaluation

## 2018-10-22 NOTE — Transfer of Care (Signed)
Immediate Anesthesia Transfer of Care Note  Patient: Susan Elliott  Procedure(s) Performed: VIDEO BRONCHOSCOPY (N/A )  Patient Location: PACU  Anesthesia Type:General  Level of Consciousness: awake, alert  and oriented  Airway & Oxygen Therapy: Patient Spontanous Breathing and Patient connected to face mask oxygen  Post-op Assessment: Report given to RN and Post -op Vital signs reviewed and stable  Post vital signs: Reviewed and stable  Last Vitals:  Vitals Value Taken Time  BP 135/83 10/22/2018  4:23 PM  Temp    Pulse 128 10/22/2018  4:25 PM  Resp 37 10/22/2018  4:25 PM  SpO2 99 % 10/22/2018  4:25 PM  Vitals shown include unvalidated device data.  Last Pain:  Vitals:   10/22/18 1328  TempSrc:   PainSc: 8       Patients Stated Pain Goal: 3 (38/25/05 3976)  Complications: No apparent anesthesia complications

## 2018-10-22 NOTE — Progress Notes (Signed)
5 Days Post-Op Procedure(s) (LRB): VIDEO BRONCHOSCOPY (N/A) VIDEO ASSISTED THORACOSCOPY with medial lymph node dissection and intercostal nerve block (Right) Right middle and lower  BILOBECTOMY (N/A) Subjective: Feels like she can't get a deep breath, not SOB   Objective: Vital signs in last 24 hours: Temp:  [98.1 F (36.7 C)-99.4 F (37.4 C)] 98.4 F (36.9 C) (02/24 0723) Pulse Rate:  [96-123] 96 (02/24 0723) Cardiac Rhythm: Sinus tachycardia (02/24 0300) Resp:  [21-30] 21 (02/24 0723) BP: (114-148)/(59-89) 114/59 (02/24 0723) SpO2:  [19 %-99 %] 98 % (02/24 0723)  Hemodynamic parameters for last 24 hours:    Intake/Output from previous day: 02/23 0701 - 02/24 0700 In: 720 [P.O.:720] Out: 0  Intake/Output this shift: No intake/output data recorded.  General appearance: alert, cooperative and no distress Neurologic: intact Heart: regular rate and rhythm Lungs: diminished breath sounds on right  Lab Results: No results for input(s): WBC, HGB, HCT, PLT in the last 72 hours. BMET: No results for input(s): NA, K, CL, CO2, GLUCOSE, BUN, CREATININE, CALCIUM in the last 72 hours.  PT/INR: No results for input(s): LABPROT, INR in the last 72 hours. ABG    Component Value Date/Time   PHART 7.408 10/18/2018 0452   HCO3 23.8 10/18/2018 0452   ACIDBASEDEF 0.2 10/18/2018 0452   O2SAT 98.6 10/18/2018 0452   CBG (last 3)  No results for input(s): GLUCAP in the last 72 hours.  Assessment/Plan: S/P Procedure(s) (LRB): VIDEO BRONCHOSCOPY (N/A) VIDEO ASSISTED THORACOSCOPY with medial lymph node dissection and intercostal nerve block (Right) Right middle and lower  BILOBECTOMY (N/A) -  CXR this AM still shows atelectasis of right upper lobe. I recommended we do a bronchoscopy to take a look at airway and clear any mucous plugs She and her mother are in agreement Will do bronch in [place] later today   LOS: 5 days    Melrose Nakayama 10/22/2018

## 2018-10-22 NOTE — Op Note (Signed)
NAME: Susan Elliott, [place] MEDICAL RECORD [id number] ACCOUNT [id number] DATE OF BIRTH:February 07, 1996 FACILITY: [facility] LOCATION: [facility] PHYSICIAN:STEVEN Chaya Jan, MD  OPERATIVE REPORT  DATE OF PROCEDURE:  10/22/2018  PREOPERATIVE DIAGNOSIS:  Right upper lobe atelectasis.  POSTOPERATIVE DIAGNOSIS:  Right upper lobe atelectasis secondary to mucus plugging.  PROCEDURE:  Bronchoscopy.  SURGEON:  Modesto Charon, MD  ASSISTANT:  None.  ANESTHESIA:  General.  FINDINGS:  Mucous plug in the right upper lobe orifice. Edema of the mucosa with narrowing of the orifice.  CLINICAL NOTE:  [name] is a 23 year old woman who had undergone a bilobectomy for carcinoid tumor approximately 5 days ago.  She developed persistent right upper lobe atelectasis.  She was advised to undergo bronchoscopy to inspect the airway  and remove mucus plugging.  The indications, risks, benefits, and alternatives were discussed in detail with the patient and her mother.  She understood and accepted the risks and agreed to proceed.  DESCRIPTION OF PROCEDURE:  [name] was brought to the operating room on 10/22/2018.  She had induction of general anesthesia and was intubated.  A timeout was performed.  Flexible fiberoptic bronchoscopy was performed via the endotracheal tube.   There was thick inspissated mucus at the origin of the right upper lobe airway.  Biopsy forceps were used to remove this mucus.  There was clear thick mucus emanating from the right upper lobe bronchus afterwards.  The bronchoscope was advanced into the upper lobe  orifice and irrigation was performed with normal saline.  There was significant edema in the mucosa at the airway closure.  The bronchoscope was removed.  The patient was extubated in the operating room and taken to the [facility] in good  condition.  TN/NUANCE  D:10/22/2018 T:10/22/2018 JOB:[id number]

## 2018-10-22 NOTE — Interval H&P Note (Signed)
History and Physical Interval Note:  10/22/2018 3:02 PM  ELSE HABERMANN  has presented today for surgery, with the diagnosis of atdectasis  The various methods of treatment have been discussed with the patient and family. After consideration of risks, benefits and other options for treatment, the patient has consented to  Procedure(s): VIDEO BRONCHOSCOPY (N/A) as a surgical intervention .  The patient's history has been reviewed, patient examined, no change in status, stable for surgery.  I have reviewed the patient's chart and labs.  Questions were answered to the patient's satisfaction.     Melrose Nakayama

## 2018-10-22 NOTE — Anesthesia Procedure Notes (Signed)
Procedure Name: Intubation Date/Time: 10/22/2018 3:50 PM Performed by: Trinna Post., CRNA Pre-anesthesia Checklist: Patient identified, Emergency Drugs available, Suction available, Patient being monitored and Timeout performed Patient Re-evaluated:Patient Re-evaluated prior to induction Oxygen Delivery Method: Circle system utilized Preoxygenation: Pre-oxygenation with 100% oxygen Induction Type: IV induction Ventilation: Mask ventilation without difficulty Laryngoscope Size: Mac and 3 Grade View: Grade I Tube type: Oral Tube size: 8.5 mm Number of attempts: 1 Airway Equipment and Method: Stylet Placement Confirmation: ETT inserted through vocal cords under direct vision,  positive ETCO2 and breath sounds checked- equal and bilateral Secured at: 23 cm Tube secured with: Tape Dental Injury: Teeth and Oropharynx as per pre-operative assessment

## 2018-10-22 NOTE — H&P (View-Only) (Signed)
5 Days Post-Op Procedure(s) (LRB): VIDEO BRONCHOSCOPY (N/A) VIDEO ASSISTED THORACOSCOPY with medial lymph node dissection and intercostal nerve block (Right) Right middle and lower  BILOBECTOMY (N/A) Subjective: Feels like she can't get a deep breath, not SOB   Objective: Vital signs in last 24 hours: Temp:  [98.1 F (36.7 C)-99.4 F (37.4 C)] 98.4 F (36.9 C) (02/24 0723) Pulse Rate:  [96-123] 96 (02/24 0723) Cardiac Rhythm: Sinus tachycardia (02/24 0300) Resp:  [21-30] 21 (02/24 0723) BP: (114-148)/(59-89) 114/59 (02/24 0723) SpO2:  [19 %-99 %] 98 % (02/24 0723)  Hemodynamic parameters for last 24 hours:    Intake/Output from previous day: 02/23 0701 - 02/24 0700 In: 720 [P.O.:720] Out: 0  Intake/Output this shift: No intake/output data recorded.  General appearance: alert, cooperative and no distress Neurologic: intact Heart: regular rate and rhythm Lungs: diminished breath sounds on right  Lab Results: No results for input(s): WBC, HGB, HCT, PLT in the last 72 hours. BMET: No results for input(s): NA, K, CL, CO2, GLUCOSE, BUN, CREATININE, CALCIUM in the last 72 hours.  PT/INR: No results for input(s): LABPROT, INR in the last 72 hours. ABG    Component Value Date/Time   PHART 7.408 10/18/2018 0452   HCO3 23.8 10/18/2018 0452   ACIDBASEDEF 0.2 10/18/2018 0452   O2SAT 98.6 10/18/2018 0452   CBG (last 3)  No results for input(s): GLUCAP in the last 72 hours.  Assessment/Plan: S/P Procedure(s) (LRB): VIDEO BRONCHOSCOPY (N/A) VIDEO ASSISTED THORACOSCOPY with medial lymph node dissection and intercostal nerve block (Right) Right middle and lower  BILOBECTOMY (N/A) -  CXR this AM still shows atelectasis of right upper lobe. I recommended we do a bronchoscopy to take a look at airway and clear any mucous plugs She and her mother are in agreement Will do bronch in [place] later today   LOS: 5 days    Melrose Nakayama 10/22/2018

## 2018-10-22 NOTE — Brief Op Note (Signed)
10/22/2018  4:20 PM  PATIENT:  Susan Elliott  23 y.o. female  PRE-OPERATIVE DIAGNOSIS:  Atelectasis  POST-OPERATIVE DIAGNOSIS:  Atelectasis secondary to mucous plug  PROCEDURE:  Procedure(s): VIDEO BRONCHOSCOPY (N/A)  SURGEON:  Surgeon(s) and Role:    * Melrose Nakayama, MD - Primary  PHYSICIAN ASSISTANT:   ASSISTANTS: none   ANESTHESIA:   general  EBL:  minimal  BLOOD ADMINISTERED:none  DRAINS: none   LOCAL MEDICATIONS USED:  NONE  SPECIMEN:  No Specimen  DISPOSITION OF SPECIMEN:  N/A  COUNTS:  NO endo  TOURNIQUET:  * No tourniquets in log *  DICTATION: .Other Dictation: Dictation Number -  PLAN OF CARE: Admit to inpatient   PATIENT DISPOSITION:  PACU - hemodynamically stable.   Delay start of Pharmacological VTE agent (>24hrs) due to surgical blood loss or risk of bleeding: no  FINDINGS: mucous plug at right upper lobe orifice

## 2018-10-23 ENCOUNTER — Inpatient Hospital Stay (HOSPITAL_COMMUNITY): Payer: 59

## 2018-10-23 ENCOUNTER — Encounter (HOSPITAL_COMMUNITY): Payer: Self-pay | Admitting: Thoracic Surgery (Cardiothoracic Vascular Surgery)

## 2018-10-23 DIAGNOSIS — C7A090 Malignant carcinoid tumor of the bronchus and lung: Secondary | ICD-10-CM

## 2018-10-23 MED ORDER — KETOROLAC TROMETHAMINE 30 MG/ML IJ SOLN
30.0000 mg | Freq: Once | INTRAMUSCULAR | Status: AC
  Administered 2018-10-23: 30 mg via INTRAVENOUS
  Filled 2018-10-23: qty 1

## 2018-10-23 MED ORDER — GUAIFENESIN ER 600 MG PO TB12
1200.0000 mg | ORAL_TABLET | Freq: Two times a day (BID) | ORAL | Status: DC
  Administered 2018-10-23 – 2018-10-24 (×3): 1200 mg via ORAL
  Filled 2018-10-23 (×3): qty 2

## 2018-10-23 NOTE — Progress Notes (Signed)
Physician notified: Roxan Hockey At: 1158  Regarding: This RN placed pt in shower assisting another nurse. unknowingly did not have order to shower. Physician notified. Pt chest tube sites stable CDI and vss. Will continue to monitor. Bedside nurse aware.

## 2018-10-23 NOTE — Anesthesia Postprocedure Evaluation (Signed)
Anesthesia Post Note  Patient: Susan Elliott  Procedure(s) Performed: VIDEO BRONCHOSCOPY (N/A )     Patient location during evaluation: PACU Anesthesia Type: General Level of consciousness: awake and alert Pain management: pain level controlled Vital Signs Assessment: post-procedure vital signs reviewed and stable Respiratory status: spontaneous breathing, nonlabored ventilation, respiratory function stable and patient connected to nasal cannula oxygen Cardiovascular status: blood pressure returned to baseline, stable and tachycardic Postop Assessment: no apparent nausea or vomiting Anesthetic complications: no    Last Vitals:  Vitals:   10/22/18 2009 10/23/18 0652  BP:  (!) 143/75  Pulse:    Resp:    Temp:  36.8 C  SpO2: 99%     Last Pain:  Vitals:   10/23/18 0652  TempSrc: Oral  PainSc:                  [name]

## 2018-10-23 NOTE — Progress Notes (Signed)
1 Day Post-Op Procedure(s) (LRB): VIDEO BRONCHOSCOPY (N/A) Subjective: Feels well this AM Has coughed up some mucous  Objective: Vital signs in last 24 hours: Temp:  [97.5 F (36.4 C)-98.5 F (36.9 C)] 98.3 F (36.8 C) (02/25 0652) Pulse Rate:  [92-130] 92 (02/24 1715) Cardiac Rhythm: Sinus tachycardia (02/25 0700) Resp:  [19-28] 19 (02/24 1715) BP: (125-156)/(67-95) 143/75 (02/25 0652) SpO2:  [91 %-99 %] 99 % (02/24 2009) Weight:  [104.3 kg] 104.3 kg (02/24 1408)  Hemodynamic parameters for last 24 hours:    Intake/Output from previous day: 02/24 0701 - 02/25 0700 In: 500 [I.V.:500] Out: 5 [Blood:5] Intake/Output this shift: No intake/output data recorded.  General appearance: alert, cooperative and no distress Neurologic: intact Heart: tachy, regular Lungs: diminished breath sounds on right  Lab Results: No results for input(s): WBC, HGB, HCT, PLT in the last 72 hours. BMET: No results for input(s): NA, K, CL, CO2, GLUCOSE, BUN, CREATININE, CALCIUM in the last 72 hours.  PT/INR: No results for input(s): LABPROT, INR in the last 72 hours. ABG    Component Value Date/Time   PHART 7.408 10/18/2018 0452   HCO3 23.8 10/18/2018 0452   ACIDBASEDEF 0.2 10/18/2018 0452   O2SAT 98.6 10/18/2018 0452   CBG (last 3)  No results for input(s): GLUCAP in the last 72 hours.  Assessment/Plan: S/P Procedure(s) (LRB): VIDEO BRONCHOSCOPY (N/A) CXR this AM shows minimal aeration in right upper lobe- slightly improved, but a long way to go Continue IS, flutter Continue mucomyst and mucinex Continue ambulation   LOS: 6 days    Susan Elliott 10/23/2018

## 2018-10-24 ENCOUNTER — Inpatient Hospital Stay (HOSPITAL_COMMUNITY): Payer: 59

## 2018-10-24 MED ORDER — HYDROCODONE-ACETAMINOPHEN 5-325 MG PO TABS: 1.0000 | ORAL_TABLET | Freq: Four times a day (QID) | ORAL | 0 refills | Status: DC | PRN

## 2018-10-24 MED ORDER — ONDANSETRON HCL 4 MG PO TABS: 4.0000 mg | ORAL_TABLET | Freq: Three times a day (TID) | ORAL | 0 refills | Status: DC | PRN

## 2018-10-24 MED ORDER — GUAIFENESIN ER 600 MG PO TB12: 1200.0000 mg | ORAL_TABLET | Freq: Two times a day (BID) | ORAL | 1 refills | Status: DC

## 2018-10-24 MED ORDER — IBUPROFEN 800 MG PO TABS: 800.0000 mg | ORAL_TABLET | Freq: Three times a day (TID) | ORAL | 0 refills | Status: DC | PRN

## 2018-10-24 MED ORDER — ALBUTEROL SULFATE HFA 108 (90 BASE) MCG/ACT IN AERS: 2.0000 | INHALATION_SPRAY | RESPIRATORY_TRACT | 2 refills | Status: DC | PRN

## 2018-10-24 MED ORDER — IBUPROFEN 200 MG PO TABS: 800.0000 mg | ORAL_TABLET | Freq: Three times a day (TID) | ORAL | Status: DC | PRN

## 2018-10-24 NOTE — Care Management Note (Signed)
Case Management Note  Patient Details  Name: Susan Elliott MRN: [id number] Date of Birth: [birth year]  Subjective/Objective:   Pt is s/p VATS - positive for lung CA                   Action/Plan:   PTA independent from home.  Pt has PCP and denied barriers with paying for medications   Expected Discharge Date:  10/24/18               Expected Discharge Plan:  Home/Self Care(Independent from home)  In-House Referral:     Discharge planning Services  CM Consult  Post Acute Care Choice:    Choice offered to:     DME Arranged:    DME Agency:     HH Arranged:    [place] Agency:     Status of Service:  Completed, signed off  If discussed at [employer], dates discussed:    Additional Comments:  Maryclare Labrador, RN 10/24/2018, 11:02 AM

## 2018-10-24 NOTE — Progress Notes (Addendum)
      [address]       [place] [address]             [phone]      2 Days Post-Op Procedure(s) (LRB): VIDEO BRONCHOSCOPY (N/A)   Subjective:  Patient with more pain today.  She states she coughed a lot yesterday and was able to get up quite a bit of sputum.  She denies shortness of breath.  Hoping to go home soon.  Objective: Vital signs in last 24 hours: Temp:  [97.7 F (36.5 C)-98.6 F (37 C)] 98 F (36.7 C) (02/26 0835) Pulse Rate:  [88-115] 112 (02/26 0835) Cardiac Rhythm: Sinus tachycardia (02/26 0800) Resp:  [20-28] 22 (02/26 0835) BP: (124-152)/(61-88) 146/79 (02/26 0835) SpO2:  [97 %-100 %] 98 % (02/26 0835) FiO2 (%):  [21 %] 21 % (02/26 0733)  Intake/Output from previous day: 02/25 0701 - 02/26 0700 In: 600 [P.O.:600] Out: -   General appearance: alert, cooperative and no distress Heart: regular rate and rhythm Lungs: clear to auscultation bilaterally Abdomen: soft, non-tender; bowel sounds normal; no masses,  no organomegaly Extremities: extremities normal, atraumatic, no cyanosis or edema Wound: clean and dry  Lab Results: No results for input(s): WBC, HGB, HCT, PLT in the last 72 hours. BMET: No results for input(s): NA, K, CL, CO2, GLUCOSE, BUN, CREATININE, CALCIUM in the last 72 hours.  PT/INR: No results for input(s): LABPROT, INR in the last 72 hours. ABG    Component Value Date/Time   PHART 7.408 10/18/2018 0452   HCO3 23.8 10/18/2018 0452   ACIDBASEDEF 0.2 10/18/2018 0452   O2SAT 98.6 10/18/2018 0452   CBG (last 3)  No results for input(s): GLUCAP in the last 72 hours.  Assessment/Plan: S/P Procedure(s) (LRB): VIDEO BRONCHOSCOPY (N/A)  1. Pulm- CXR with some improvement, patient coughed up a lot of sputum yesterday, continue flutter valve, nebulizer treatments 2. Pain- increased due to coughing, on Norco with some relief, will add Ibuprofen 3. dispo- patient stable, CXR with some improvement, continue aggressive  pulmonary toilet, repeat CXR in AM, Ibuprofen for additional pain relief   LOS: 7 days    Ellwood Handler 10/24/2018 CXR shows definite improvement today. Still has a lot of atelectasis but that will take awhile to completely resolve. Will let her go home today with plan to follow up with in the office with a CXR on Monday Continue mucinex. She will continue to work with the IS and flutter valve at home  Gilmer. Roxan Hockey, MD [facility] [phone]

## 2018-10-24 NOTE — Progress Notes (Signed)
Discharged home accompanied by mother, discharge instructions given, belongings taken home.

## 2018-10-25 ENCOUNTER — Other Ambulatory Visit: Payer: Self-pay | Admitting: *Deleted

## 2018-10-25 ENCOUNTER — Ambulatory Visit: Payer: 59 | Admitting: Pulmonary Disease

## 2018-10-25 NOTE — Progress Notes (Signed)
The proposed treatment discussed in cancer conference 10/25/2018 is for discussion purpose only and is not a binding recommendation.  The patient was not physically examined nor present for their treatment options.  Therefore, final treatment plans cannot be decided.

## 2018-10-26 ENCOUNTER — Other Ambulatory Visit: Payer: Self-pay | Admitting: Thoracic Surgery (Cardiothoracic Vascular Surgery)

## 2018-10-26 DIAGNOSIS — C7A090 Malignant carcinoid tumor of the bronchus and lung: Secondary | ICD-10-CM

## 2018-10-28 LAB — TYPE AND SCREEN
ABO/RH(D): A POS
Antibody Screen: POSITIVE
PT AG Type: NEGATIVE
Unit division: 0
Unit division: 0

## 2018-10-28 LAB — BPAM RBC
Blood Product Expiration Date: 202003112359
Unit Type and Rh: 6200

## 2018-10-29 ENCOUNTER — Other Ambulatory Visit: Payer: Self-pay | Admitting: *Deleted

## 2018-10-29 ENCOUNTER — Ambulatory Visit (INDEPENDENT_AMBULATORY_CARE_PROVIDER_SITE_OTHER): Payer: Self-pay | Admitting: Thoracic Surgery (Cardiothoracic Vascular Surgery)

## 2018-10-29 ENCOUNTER — Ambulatory Visit
Admission: RE | Admit: 2018-10-29 | Discharge: 2018-10-29 | Disposition: A | Discharge status: Home / Self Care | Payer: 59 | Source: Ambulatory Visit | Attending: Thoracic Surgery (Cardiothoracic Vascular Surgery) | Admitting: Thoracic Surgery (Cardiothoracic Vascular Surgery)

## 2018-10-29 VITALS — BP 122/76 | HR 106 | Resp 20 | Ht 67.0 in | Wt 231.0 lb

## 2018-10-29 DIAGNOSIS — C7A090 Malignant carcinoid tumor of the bronchus and lung: Secondary | ICD-10-CM

## 2018-10-29 DIAGNOSIS — R918 Other nonspecific abnormal finding of lung field: Secondary | ICD-10-CM

## 2018-10-29 DIAGNOSIS — Z902 Acquired absence of lung [part of]: Secondary | ICD-10-CM

## 2018-10-29 NOTE — Progress Notes (Signed)
[address]       [place] [address]             [phone]      HPI: Susan Elliott returns for scheduled follow-up visit  Susan Elliott is a 23 year old woman who presented with cough and wheezing.  She was ultimately found to have a mass in the bronchus intermedius.  She developed hemoptysis.  The mass did light up on a PET dotatate scan.  I did a bilobectomy on 10/18/2018.  Postoperatively she developed atelectasis secondary to mucous plugging had to go back to the OR for bronchoscopy on the 24th.  After that she was able to be discharged on the 26th.  She still has significant atelectasis at that point in time.  She says she still feels short of breath with exertion.  She is having trouble sleeping.  She hss not had to take any narcotics in the past couple of days.  Past Medical History:  Diagnosis Date  . Asthma   . Celiac disease 2011  . Complication of anesthesia    migraine x3 days  . Dyspnea     1/ 23/2020- just since Ive had pneumonia  . Migraine   . Pneumonia 2019 aug  . Pneumonia due to Streptococcus pneumoniae ([place]) 08/09/2018     Current Outpatient Medications  Medication Sig Dispense Refill  . acetaminophen (TYLENOL) 325 MG tablet Take 650 mg by mouth every 6 (six) hours as needed (for pain.).    Marland Kitchen albuterol (PROVENTIL HFA;VENTOLIN HFA) 108 (90 Base) MCG/ACT inhaler Inhale 2 puffs into the lungs every 4 (four) hours as needed for wheezing or shortness of breath. 1 Inhaler 2  . benzonatate (TESSALON) 200 MG capsule Take 1 capsule (200 mg total) by mouth 3 (three) times daily as needed for cough. 45 capsule 1  . Biotin 5000 MCG TABS Take 5,000 mcg by mouth daily.    . cetirizine (ZYRTEC) 10 MG tablet Take 10 mg by mouth daily.    Marland Kitchen guaiFENesin (MUCINEX) 600 MG 12 hr tablet Take 2 tablets (1,200 mg total) by mouth 2 (two) times daily. 120 tablet 1  . HYDROcodone-acetaminophen (NORCO/VICODIN) 5-325 MG tablet Take 1 tablet by mouth every 6 (six)  hours as needed for severe pain. 28 tablet 0  . ibuprofen (ADVIL,MOTRIN) 800 MG tablet Take 1 tablet (800 mg total) by mouth every 8 (eight) hours as needed for moderate pain. 30 tablet 0  . Multiple Vitamin (MULTIVITAMIN WITH MINERALS) TABS tablet Take 1 tablet by mouth daily.    . Norethin Ace-Eth Estrad-FE 1-20 MG-MCG(24) CHEW CHEW 1 TABLET DAILY (Patient taking differently: Chew 1 tablet by mouth every evening. (1900)) 84 tablet 4  . ondansetron (ZOFRAN) 4 MG tablet Take 1 tablet (4 mg total) by mouth every 8 (eight) hours as needed for nausea or vomiting. 30 tablet 0   No current facility-administered medications for this visit.     Physical Exam BP 122/76   Pulse (!) 106   Resp 20   Ht 5' 7"  (1.702 m)   Wt 231 lb (104.8 kg)   SpO2 98% Comment: RA  BMI 36.36 kg/m  23 year old woman in no acute distress Alert and oriented x3 with no focal deficits Cardiac regular rate, mildly tachycardic Lungs faint wheeze on right, diminished breath sounds right base, left clear Incisions clean dry and intact  Diagnostic Tests: CHEST - 2 VIEW  COMPARISON:  10/24/2018  FINDINGS: Lungs are adequately inflated with interval  improvement in a moderate size right pleural effusion occupying approximately 40% of the right lung. Likely associated right basilar atelectasis. Left lung is clear. No pneumothorax. Cardiomediastinal silhouette and remainder the exam is unchanged.  IMPRESSION: Interval improvement in right-sided pleural effusion with residual moderate size right pleural effusion likely with associated basilar atelectasis.   Electronically Signed   By: Marin Olp M.D.   On: 10/29/2018 15:33 I personally reviewed the chest x-ray images.  I think the majority of the opacity at the right base is due to volume loss from the bilobectomy.  I do think there is some effusion.  Impression: Susan Elliott is a 23 year old woman who underwent a bilobectomy for carcinoid tumor she had  multiple dramatically enlarged lymph nodes and severe inflammation around the bronchus but all those nodes ended up being benign and reactive.  She developed mucous plugging in the early postoperative period and had to have bronchoscopy to clear that.  Today her chest x-ray is much improved.  She may have a small effusion in addition to the volume loss from the bilobectomy.  I am going to set her up for an ultrasound-guided thoracentesis so that if there is significant fluid that can be drained.  Her exercise tolerance is still limited but should improve significantly over time.  She is doing very well from a pain standpoint and is not taking any narcotics in the past couple of days.  She is less than 2 weeks out from surgery so that is excellent.  She should continue her current medications including Tessalon as needed and guaifenesin for now.  Plan: Ultrasound-guided right thoracentesis Return in 1 week with PA and lateral chest x-ray  Melrose Nakayama, MD [facility] [phone]

## 2018-10-31 ENCOUNTER — Ambulatory Visit (HOSPITAL_COMMUNITY)
Admission: RE | Admit: 2018-10-31 | Discharge: 2018-10-31 | Disposition: A | Discharge status: Home / Self Care | Payer: 59 | Source: Ambulatory Visit | Attending: Student | Admitting: Student

## 2018-10-31 ENCOUNTER — Encounter (HOSPITAL_COMMUNITY): Payer: Self-pay | Admitting: Student

## 2018-10-31 ENCOUNTER — Ambulatory Visit (HOSPITAL_COMMUNITY)
Admission: RE | Admit: 2018-10-31 | Discharge: 2018-10-31 | Disposition: A | Discharge status: Home / Self Care | Payer: 59 | Source: Ambulatory Visit | Attending: Thoracic Surgery (Cardiothoracic Vascular Surgery) | Admitting: Thoracic Surgery (Cardiothoracic Vascular Surgery)

## 2018-10-31 ENCOUNTER — Other Ambulatory Visit: Payer: Self-pay | Admitting: Thoracic Surgery (Cardiothoracic Vascular Surgery)

## 2018-10-31 ENCOUNTER — Other Ambulatory Visit (HOSPITAL_COMMUNITY): Payer: Self-pay | Admitting: Student

## 2018-10-31 DIAGNOSIS — J90 Pleural effusion, not elsewhere classified: Secondary | ICD-10-CM | POA: Insufficient documentation

## 2018-10-31 DIAGNOSIS — Z902 Acquired absence of lung [part of]: Secondary | ICD-10-CM

## 2018-10-31 HISTORY — PX: IR THORACENTESIS ASP PLEURAL SPACE W/IMG GUIDE: IMG5380

## 2018-10-31 MED ORDER — LIDOCAINE HCL 1 % IJ SOLN
INTRAMUSCULAR | Status: AC | PRN
  Administered 2018-10-31: 10 mL

## 2018-10-31 MED ORDER — LIDOCAINE HCL 1 % IJ SOLN
INTRAMUSCULAR | Status: AC
  Filled 2018-10-31: qty 20

## 2018-10-31 NOTE — Procedures (Signed)
PROCEDURE SUMMARY:  Successful US guided therapeutic right thoracentesis. Yielded 250 mL of yellow fluid. Pt tolerated procedure well. No immediate complications.  Specimen was not sent for labs. CXR ordered.  EBL < 5 mL  Docia Barrier PA-C 10/31/2018 1:45 PM

## 2018-11-02 ENCOUNTER — Other Ambulatory Visit: Payer: Self-pay | Admitting: Thoracic Surgery (Cardiothoracic Vascular Surgery)

## 2018-11-02 DIAGNOSIS — C7A090 Malignant carcinoid tumor of the bronchus and lung: Secondary | ICD-10-CM

## 2018-11-05 ENCOUNTER — Other Ambulatory Visit: Payer: Self-pay | Admitting: Thoracic Surgery (Cardiothoracic Vascular Surgery)

## 2018-11-05 ENCOUNTER — Ambulatory Visit: Payer: Self-pay | Admitting: Thoracic Surgery (Cardiothoracic Vascular Surgery)

## 2018-11-05 DIAGNOSIS — C7A090 Malignant carcinoid tumor of the bronchus and lung: Secondary | ICD-10-CM

## 2018-11-06 ENCOUNTER — Ambulatory Visit (INDEPENDENT_AMBULATORY_CARE_PROVIDER_SITE_OTHER): Payer: Self-pay | Admitting: Thoracic Surgery (Cardiothoracic Vascular Surgery)

## 2018-11-06 ENCOUNTER — Ambulatory Visit
Admission: RE | Admit: 2018-11-06 | Discharge: 2018-11-06 | Disposition: A | Discharge status: Home / Self Care | Payer: 59 | Source: Ambulatory Visit | Attending: Thoracic Surgery (Cardiothoracic Vascular Surgery) | Admitting: Thoracic Surgery (Cardiothoracic Vascular Surgery)

## 2018-11-06 VITALS — BP 120/62 | HR 100 | Resp 20 | Ht 67.0 in | Wt 234.0 lb

## 2018-11-06 DIAGNOSIS — C7A090 Malignant carcinoid tumor of the bronchus and lung: Secondary | ICD-10-CM

## 2018-11-06 DIAGNOSIS — Z902 Acquired absence of lung [part of]: Secondary | ICD-10-CM

## 2018-11-06 NOTE — Progress Notes (Signed)
[address]       [place] [address]             [phone]     HPI: Ms. Negrette returns for scheduled follow-up visit  Brettany Sydney is a 23 year old woman who presented originally with cough and wheezing.  She was found to have a mass obstructing the bronchus intermedius.  She underwent a bilobectomy on 10/18/2018.  She developed atelectasis secondary to mucous plugging.  She ultimately went home on day 6.  I saw her in the office last week.  Her x-ray was improved but it did appear she might have a small pleural effusion.  We did an ultrasound-guided thoracentesis but she only had about 250 mL of fluid.  She did have some pain with that procedure.  She continues to have a cough.  She is taking the Tessalon occasionally.  She is not taken a pain pill in the past week.  She has noted some improvement in her wheezing but she does still have it from time to time.  She does still get short of breath with relatively mild activities  Past Medical History:  Diagnosis Date  . Asthma   . Celiac disease 2011  . Complication of anesthesia    migraine x3 days  . Dyspnea     1/ 23/2020- just since Ive had pneumonia  . Migraine   . Pneumonia 2019 aug  . Pneumonia due to Streptococcus pneumoniae ([place]) 08/09/2018    Current Outpatient Medications  Medication Sig Dispense Refill  . acetaminophen (TYLENOL) 325 MG tablet Take 650 mg by mouth every 6 (six) hours as needed (for pain.).    Marland Kitchen albuterol (PROVENTIL HFA;VENTOLIN HFA) 108 (90 Base) MCG/ACT inhaler Inhale 2 puffs into the lungs every 4 (four) hours as needed for wheezing or shortness of breath. 1 Inhaler 2  . benzonatate (TESSALON) 200 MG capsule Take 1 capsule (200 mg total) by mouth 3 (three) times daily as needed for cough. 45 capsule 1  . Biotin 5000 MCG TABS Take 5,000 mcg by mouth daily.    . cetirizine (ZYRTEC) 10 MG tablet Take 10 mg by mouth daily.    Marland Kitchen guaiFENesin (MUCINEX) 600 MG 12 hr tablet Take 2  tablets (1,200 mg total) by mouth 2 (two) times daily. 120 tablet 1  . HYDROcodone-acetaminophen (NORCO/VICODIN) 5-325 MG tablet Take 1 tablet by mouth every 6 (six) hours as needed for severe pain. 28 tablet 0  . ibuprofen (ADVIL,MOTRIN) 800 MG tablet Take 1 tablet (800 mg total) by mouth every 8 (eight) hours as needed for moderate pain. 30 tablet 0  . Multiple Vitamin (MULTIVITAMIN WITH MINERALS) TABS tablet Take 1 tablet by mouth daily.    . Norethin Ace-Eth Estrad-FE 1-20 MG-MCG(24) CHEW CHEW 1 TABLET DAILY (Patient taking differently: Chew 1 tablet by mouth every evening. (1900)) 84 tablet 4  . ondansetron (ZOFRAN) 4 MG tablet Take 1 tablet (4 mg total) by mouth every 8 (eight) hours as needed for nausea or vomiting. 30 tablet 0   No current facility-administered medications for this visit.     Physical Exam BP 120/62   Pulse 100   Resp 20   Ht 5' 7"  (1.702 m)   Wt 234 lb (106.1 kg)   SpO2 99% Comment: RA  BMI 36.47 kg/m  23 year old woman in no acute distress Alert and oriented x3 with no focal deficits Cardiac regular rate and rhythm Lungs faint wheezing with deep inspiration on right  side, no wheezing on left Incisions healing well  Diagnostic Tests: CHEST - 2 VIEW  COMPARISON:  October 31, 2018  FINDINGS: A moderate right-sided pleural effusion is again identified. The heart, hila, mediastinum, lungs, and pleura are otherwise unchanged and unremarkable.  IMPRESSION: Persistent moderate right-sided pleural effusion.  No other change.   Electronically Signed   By: Dorise Bullion III M.D   On: 11/06/2018 11:35 I personally reviewed the chest x-ray images.  She has volume loss due to bilobectomy with a small effusion.  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  She underwent a bilobectomy about 3 weeks ago.  She had some mucus plugging and atelectasis initially.  Once that cleared she  is made good progress.  Her lungs sound better on exam today particularly the right side the left has been clear.  Her chest x-ray shows no significant atelectasis.  She does have a small effusion.  She is doing well from a pain standpoint.  She has not taken any narcotics in the past week.  She may begin driving.  Appropriate precautions were discussed.  She can start to increase her activities at this point but should do so on a slow and gradual basis.  I do not think she is ready to go back to work yet.  I will see her back in a month with a chest x-ray will discuss returning to work at that time.  Stage Ia carcinoid tumor-resected.  No additional treatment is necessary.  Plan: Return in 1 month with PA and lateral chest x-ray.  We will discuss return to work at that time.  Melrose Nakayama, MD [facility] [phone]

## 2018-11-07 LAB — CULTURE, FUNGUS WITHOUT SMEAR

## 2018-11-13 ENCOUNTER — Ambulatory Visit: Payer: 59 | Admitting: Thoracic Surgery (Cardiothoracic Vascular Surgery)

## 2018-12-10 ENCOUNTER — Other Ambulatory Visit: Payer: Self-pay

## 2018-12-11 ENCOUNTER — Ambulatory Visit (INDEPENDENT_AMBULATORY_CARE_PROVIDER_SITE_OTHER): Payer: Self-pay | Admitting: Thoracic Surgery (Cardiothoracic Vascular Surgery)

## 2018-12-11 ENCOUNTER — Encounter: Payer: Self-pay | Admitting: Thoracic Surgery (Cardiothoracic Vascular Surgery)

## 2018-12-11 ENCOUNTER — Ambulatory Visit
Admission: RE | Admit: 2018-12-11 | Discharge: 2018-12-11 | Disposition: A | Discharge status: Home / Self Care | Payer: 59 | Source: Ambulatory Visit | Attending: Thoracic Surgery (Cardiothoracic Vascular Surgery) | Admitting: Thoracic Surgery (Cardiothoracic Vascular Surgery)

## 2018-12-11 VITALS — BP 128/78 | HR 97 | Temp 97.0°F | Resp 16 | Ht 67.0 in | Wt 248.0 lb

## 2018-12-11 DIAGNOSIS — C7A090 Malignant carcinoid tumor of the bronchus and lung: Secondary | ICD-10-CM

## 2018-12-11 DIAGNOSIS — Z902 Acquired absence of lung [part of]: Secondary | ICD-10-CM

## 2018-12-11 DIAGNOSIS — R918 Other nonspecific abnormal finding of lung field: Secondary | ICD-10-CM

## 2018-12-11 NOTE — Progress Notes (Signed)
Susan Elliott       ,Susan Elliott     HPI: Susan Elliott returns for a scheduled follow-up  Susan Elliott is a 23 year old woman with a history of celiac disease, migraines, and asthma.  She presented with pneumonia back in the fall.  She would respond to treatment but her symptoms would recur relatively rapidly.  She also was found to have an endobronchial mass.  That turned out to be a carcinoid tumor.  I did a thoracoscopic right lower and middle lobectomy on 10/17/2018.  There multiple markedly enlarged lymph nodes (up to 4 cm), but fortunately all of those turned out to be negative on final pathology.  She had some mucus plugging postoperatively and we had to do a bronchoscopy.  Once that cleared up she did well and went home.  Last saw her in the office on 11/06/2018.  She was doing much better at that time but still had a frequent cough and wheezing.  In the interim since her last visit she is continued to improve.  She is not having any significant pain.  Her cough is resolved.  She does still notice wheezing particularly when she exerts herself.  She has been using her inhaler as needed.  Past Medical History:  Diagnosis Date  . Asthma   . Celiac disease 2011  . Complication of anesthesia    migraine x3 days  . Dyspnea     1/ 23/2020- just since Ive had pneumonia  . Migraine   . Pneumonia 2019 aug  . Pneumonia due to Streptococcus pneumoniae ([place]) 08/09/2018    Current Outpatient Medications  Medication Sig Dispense Refill  . acetaminophen (TYLENOL) 325 MG tablet Take 650 mg by mouth every 6 (six) hours as needed (for pain.).    Marland Kitchen albuterol (PROVENTIL HFA;VENTOLIN HFA) 108 (90 Base) MCG/ACT inhaler Inhale 2 puffs into the lungs every 4 (four) hours as needed for wheezing or shortness of breath. 1 Inhaler 2  . benzonatate (TESSALON) 200 MG capsule Take 1 capsule (200 mg total) by mouth 3 (three) times daily as needed for cough.  45 capsule 1  . Biotin 5000 MCG TABS Take 5,000 mcg by mouth daily.    . cetirizine (ZYRTEC) 10 MG tablet Take 10 mg by mouth daily.    Marland Kitchen guaiFENesin (MUCINEX) 600 MG 12 hr tablet Take 2 tablets (1,200 mg total) by mouth 2 (two) times daily. 120 tablet 1  . Multiple Vitamin (MULTIVITAMIN WITH MINERALS) TABS tablet Take 1 tablet by mouth daily.    . Norethin Ace-Eth Estrad-FE 1-20 MG-MCG(24) CHEW CHEW 1 TABLET DAILY (Patient taking differently: Chew 1 tablet by mouth every evening. (1900)) 84 tablet 4  . ondansetron (ZOFRAN) 4 MG tablet Take 1 tablet (4 mg total) by mouth every 8 (eight) hours as needed for nausea or vomiting. 30 tablet 0  . HYDROcodone-acetaminophen (NORCO/VICODIN) 5-325 MG tablet Take 1 tablet by mouth every 6 (six) hours as needed for severe pain. (Patient not taking: Reported on 12/11/2018) 28 tablet 0  . ibuprofen (ADVIL,MOTRIN) 800 MG tablet Take 1 tablet (800 mg total) by mouth every 8 (eight) hours as needed for moderate pain. (Patient not taking: Reported on 12/11/2018) 30 tablet 0   No current facility-administered medications for this visit.     Physical Exam BP 128/78 (BP Location: Right Arm, Patient Position: Sitting, Cuff Size: Large)   Pulse  97   Temp (!) 97 F (36.1 C) (Oral)   Resp 16   Ht 5' 7"  (1.702 m)   Wt 248 lb (112.5 kg)   SpO2 99% Comment: RA  BMI 38.76 kg/m  23 year old woman in no acute distress Alert and oriented x3 with no focal deficits Lungs absent breath sounds at right base right lung clear left lung remains clear Incisions well-healed Cardiac regular rate and rhythm normal S1 and S2  Diagnostic Tests: CHEST - 2 VIEW  COMPARISON:  November 06, 2018  FINDINGS: There is a persistent right-sided pleural effusion. Postsurgical changes in the right base. The heart, hila, and mediastinum are unchanged. No pneumothorax. The left lung is clear. No other acute abnormalities.  IMPRESSION: Persistent right-sided pleural effusion.  No other  change.   Electronically Signed   By: Dorise Bullion III M.D   On: 12/11/2018 09:09 I reviewed the chest x-ray images.  Findings consistent with previous bilobectomy.  Minimal pleural effusion.  Impression: Susan Elliott is a 23 year old woman who presented with recurrent pneumonias.  She was found to have an endobronchial mass lesion in the bronchus intermedius.  I did a thoracoscopic right lower and middle bilobectomy about 6 weeks ago.  She is doing very well at this time.  She has minimal incisional discomfort.  She still notices some wheezing with exertion but that has improved dramatically.  Her cough has resolved.  There are no restrictions on her activities at this time.  Her return to work is a little tricky year.  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  Given the current COVID situation, I do not think it is safe for her to return to work at this time.  We have a tentative return to work date of May 18.  I will plan to do a phone visit with her in about a month and we can reassess the situation and determine whether it safe for her to return to work.  She had questions about the use of NSAIDs for her migraines.  I informed her of the concern for NSAIDs as it relates to [place].  I think it is reasonable for her to use those but only as needed.  She will try to use acetaminophen when possible.  Plan: We will arrange for a telephone visit in 1 month to discuss return to work.  We will plan to see her back in 3 months with a PA and lateral chest x-ray.  Melrose Nakayama, MD [facility] [phone]

## 2018-12-13 ENCOUNTER — Ambulatory Visit: Payer: 59 | Admitting: Primary Care

## 2018-12-20 ENCOUNTER — Ambulatory Visit: Payer: 59 | Admitting: Neurology

## 2018-12-20 ENCOUNTER — Telehealth: Payer: Self-pay | Admitting: Thoracic Surgery (Cardiothoracic Vascular Surgery)

## 2018-12-20 NOTE — Telephone Encounter (Signed)
Patient's mother called to state that the patient had coughed up a small amount of blood.  Prior to that she had been doing well and she specifically denied symptoms of fevers, chills, productive cough, or shortness of breath.  She had not had increased pain.  She has not been around any persons with known or suspected COVID infection.  I advised the patient's mother to continue to monitor the patient closely and look for signs of fevers and shortness of breath.  I advised her to go straight to the emergency department if she develops significant shortness of breath or if she begins coughing up blood and larger quantity.  She will call our office during office hours if she continues to have problems or concerns for further advice.  All questions answered.  Rexene Alberts, MD 12/20/2018 6:35 PM

## 2018-12-21 ENCOUNTER — Telehealth: Payer: Self-pay

## 2018-12-21 DIAGNOSIS — R05 Cough: Secondary | ICD-10-CM

## 2018-12-21 DIAGNOSIS — R059 Cough, unspecified: Secondary | ICD-10-CM

## 2018-12-21 NOTE — Telephone Encounter (Signed)
-----   Message from Melrose Nakayama, MD sent at 12/21/2018  3:20 PM EDT ----- Regarding: RE: coughing up blood Tell her to call me if she has any more or if she has fever, chills etc. Set her up for a CXR and phone visit on Monday  [facility] ----- Message ----- From: Marylen Ponto, LPN Sent: [id number]  11:19 AM EDT To: Melrose Nakayama, MD Subject: coughing up blood                              Susan Elliott called this am stating that she had coughed up bright red blood yesterday after eating  the amount was about 2 tablespoons. She denies any fevers, chest pain,dizziness or any type of trama.  She said her shortness of breath is the same, no increase. She is ok this am and has not had any more blood in sputum but it still c/o cough.   She talked to Dr Roxy Manns on the phone last night about it/ see note in [place]. She is schedule for telephone visit with you on 01/08/2019  She wanted to make sure you knew. Please Advise Linden Dolin

## 2018-12-24 ENCOUNTER — Ambulatory Visit
Admission: RE | Admit: 2018-12-24 | Discharge: 2018-12-24 | Disposition: A | Discharge status: Home / Self Care | Payer: 59 | Source: Ambulatory Visit | Attending: Thoracic Surgery (Cardiothoracic Vascular Surgery) | Admitting: Thoracic Surgery (Cardiothoracic Vascular Surgery)

## 2018-12-24 ENCOUNTER — Telehealth (INDEPENDENT_AMBULATORY_CARE_PROVIDER_SITE_OTHER): Payer: Self-pay | Admitting: Thoracic Surgery (Cardiothoracic Vascular Surgery)

## 2018-12-24 DIAGNOSIS — Z902 Acquired absence of lung [part of]: Secondary | ICD-10-CM

## 2018-12-24 DIAGNOSIS — R059 Cough, unspecified: Secondary | ICD-10-CM

## 2018-12-24 DIAGNOSIS — R05 Cough: Secondary | ICD-10-CM

## 2018-12-24 MED ORDER — GUAIFENESIN ER 600 MG PO TB12: 1200.0000 mg | ORAL_TABLET | Freq: Two times a day (BID) | ORAL | 1 refills | Status: DC

## 2018-12-24 MED ORDER — BENZONATATE 200 MG PO CAPS: 200.0000 mg | ORAL_CAPSULE | Freq: Three times a day (TID) | ORAL | 1 refills | Status: DC | PRN

## 2018-12-24 NOTE — Progress Notes (Signed)
      [address]       [place] [address]             [phone]      Telephone visit with Jay Schlichter due to COVID restrictions  Last week she had an episode of coughing up blood. Has had a productive cough over the past several days. Mucous is clear with no signs of blood. One low grade temp of 99.5. Otherwise feels well. No shortness of breath.  Chest xray done today  CHEST - 2 VIEW  COMPARISON:  Radiograph 12/11/2018  FINDINGS: Normal cardiac silhouette. LEFT lung is clear. Persistent RIGHT pleural effusion occupying approximately third of the RIGHT hemithorax. No pneumothorax. No acute osseous abnormality.  IMPRESSION: No interval change in moderate volume RIGHT pleural effusion.   Electronically Signed   By: Suzy Bouchard M.D.   On: 12/24/2018 09:31 I personally reviewed the CXR images. Volume loss due to resection with minimal effusion. No infiltrates or atelectasis  Impression- hemoptysis. Single episode 4 days ago with no residual. No fevers or Xray findings to suggest infection. Likely from granulation tissue at bronchial closure.   Will renew Mucinex and Tessalon prescriptions Will arrange follow up in about 3 weeks to consider bronchoscopy.  If she has any additional bleeding she will notify us immediately  Remo Lipps C. Roxan Hockey, MD [facility] [phone]

## 2019-01-08 ENCOUNTER — Telehealth: Payer: 59 | Admitting: Thoracic Surgery (Cardiothoracic Vascular Surgery)

## 2019-01-08 ENCOUNTER — Other Ambulatory Visit: Payer: Self-pay

## 2019-01-08 ENCOUNTER — Telehealth (INDEPENDENT_AMBULATORY_CARE_PROVIDER_SITE_OTHER): Payer: Self-pay | Admitting: Thoracic Surgery (Cardiothoracic Vascular Surgery)

## 2019-01-08 ENCOUNTER — Ambulatory Visit: Payer: 59 | Admitting: Thoracic Surgery (Cardiothoracic Vascular Surgery)

## 2019-01-08 DIAGNOSIS — D3A090 Benign carcinoid tumor of the bronchus and lung: Secondary | ICD-10-CM

## 2019-01-08 NOTE — Progress Notes (Signed)
Telephone visit with Jay Schlichter.  I did a telephone visit with Mersades Barbaro. Traughber date of birth [birth year].  This was done due to the current COVID situation it was felt to be safest for the patient.  Eymi Lipuma is a 23 year old woman who presented with recurrent respiratory infections.  She was found to have an obstructing endobronchial carcinoid tumor in the bronchus intermedius just beyond the takeoff of the right upper lobe bronchus.  She underwent a right middle and lower bilobectomy on 10/17/2018.  In the early postoperative period she had mucous plugging.  Once that resolved she was able to go home.  She called in on 12/20/2018 after an episode of hemoptysis.  She spoke to Dr. Roxy Manns that day and then I talked to her on 12/24/2018.  A chest x-ray at that time showed no change.  It was read as moderate pleural effusion, but it really just postoperative changes from the bilobectomy.  She has not had any further hemoptysis.  She continues to do well.  She has not had any hemoptysis since that one episode.  She will occasionally have coughing spasms but it does not occur every day.  She has not had any significant wheezing.  She works as a Quarry manager at [employer].  She is concerned about going back to work due to COVID-19.  I am concerned as well.  I do not think she is at increased risk for contracting it but would be at increased risk for complications or poor outcome if she were to contract it.  I recommended that we wait until June 1 for her to go back to work.  That will allow [place] to see if there is any uptake in cases with the reopening.  I did advise her to check with [facility] and make sure there is not been an outbreak there prior to returning to work.  She is scheduled to see me next week with a chest x-ray.  Revonda Standard Roxan Hockey, MD [facility] [phone]

## 2019-01-14 ENCOUNTER — Other Ambulatory Visit: Payer: Self-pay | Admitting: Thoracic Surgery (Cardiothoracic Vascular Surgery)

## 2019-01-14 ENCOUNTER — Other Ambulatory Visit: Payer: Self-pay

## 2019-01-14 DIAGNOSIS — Z902 Acquired absence of lung [part of]: Secondary | ICD-10-CM

## 2019-01-15 ENCOUNTER — Other Ambulatory Visit: Payer: Self-pay | Admitting: Physician Assistant

## 2019-01-15 ENCOUNTER — Ambulatory Visit: Payer: 59 | Admitting: Thoracic Surgery (Cardiothoracic Vascular Surgery)

## 2019-01-15 ENCOUNTER — Ambulatory Visit (INDEPENDENT_AMBULATORY_CARE_PROVIDER_SITE_OTHER): Payer: Self-pay | Admitting: Thoracic Surgery (Cardiothoracic Vascular Surgery)

## 2019-01-15 ENCOUNTER — Ambulatory Visit
Admission: RE | Admit: 2019-01-15 | Discharge: 2019-01-15 | Disposition: A | Discharge status: Home / Self Care | Payer: 59 | Source: Ambulatory Visit | Attending: Thoracic Surgery (Cardiothoracic Vascular Surgery) | Admitting: Thoracic Surgery (Cardiothoracic Vascular Surgery)

## 2019-01-15 ENCOUNTER — Encounter: Payer: Self-pay | Admitting: Thoracic Surgery (Cardiothoracic Vascular Surgery)

## 2019-01-15 VITALS — BP 110/70 | HR 110 | Temp 99.1°F | Resp 16 | Ht 67.0 in | Wt 247.0 lb

## 2019-01-15 DIAGNOSIS — Z902 Acquired absence of lung [part of]: Secondary | ICD-10-CM

## 2019-01-15 DIAGNOSIS — D3A090 Benign carcinoid tumor of the bronchus and lung: Secondary | ICD-10-CM

## 2019-01-15 NOTE — Progress Notes (Signed)
[address]       [place] [address]             [phone]      HPI: [name] returns for a scheduled follow-up visit  Susan Elliott is a 23 year old woman who presented with recurrent respiratory infections.  She was found to have an obstructing endobronchial carcinoid tumor in the bronchus intermedius just distal to the takeoff of the right upper lobe bronchus.  She underwent a right middle and lower bilobectomy on 10/17/2018.  On the chest x-ray on 10/29/2018 she appeared to have a small pleural effusion.  She had a thoracentesis which drained only 250 mL of fluid.  There was no residual effusion.  Her chest x-ray was minimally changed.  On 12/20/2018 she had an episode of hemoptysis.  She spoke with Dr. Roxy Manns.  A chest x-ray showed no change.  She has not had any further hemoptysis since that time.  She says that her exercise tolerance is improving.  She feels better.  She is not having any wheezing.  She has not had any additional hemoptysis.  Past Medical History:  Diagnosis Date  . Asthma   . Celiac disease 2011  . Complication of anesthesia    migraine x3 days  . Dyspnea     1/ 23/2020- just since Ive had pneumonia  . Migraine   . Pneumonia 2019 aug  . Pneumonia due to Streptococcus pneumoniae ([place]) 08/09/2018    Current Outpatient Medications  Medication Sig Dispense Refill  . acetaminophen (TYLENOL) 325 MG tablet Take 650 mg by mouth every 6 (six) hours as needed (for pain.).    Marland Kitchen albuterol (PROVENTIL HFA;VENTOLIN HFA) 108 (90 Base) MCG/ACT inhaler Inhale 2 puffs into the lungs every 4 (four) hours as needed for wheezing or shortness of breath. 1 Inhaler 2  . benzonatate (TESSALON) 200 MG capsule Take 1 capsule (200 mg total) by mouth 3 (three) times daily as needed for cough. 45 capsule 1  . Biotin 5000 MCG TABS Take 5,000 mcg by mouth daily.    . cetirizine (ZYRTEC) 10 MG tablet Take 10 mg by mouth daily.    Marland Kitchen guaiFENesin (MUCINEX) 600 MG 12  hr tablet Take 2 tablets (1,200 mg total) by mouth 2 (two) times daily. 14 tablet 1  . Multiple Vitamin (MULTIVITAMIN WITH MINERALS) TABS tablet Take 1 tablet by mouth daily.    . Norethin Ace-Eth Estrad-FE 1-20 MG-MCG(24) CHEW CHEW 1 TABLET DAILY (Patient taking differently: Chew 1 tablet by mouth every evening. (1900)) 84 tablet 4  . ondansetron (ZOFRAN) 4 MG tablet Take 1 tablet (4 mg total) by mouth every 8 (eight) hours as needed for nausea or vomiting. 30 tablet 0   No current facility-administered medications for this visit.     Physical Exam BP 110/70 (BP Location: Left Arm, Patient Position: Sitting, Cuff Size: Large)   Pulse (!) 110   Temp 99.1 F (37.3 C) (Oral)   Resp 16   Ht 5' 7"  (1.702 m)   Wt 247 lb (112 kg)   SpO2 98% Comment: RA  BMI 38.69 kg/m  23 year old woman in no acute distress Alert and oriented x3 with no focal deficits Lungs diminished at right base, otherwise clear no rales or wheezing Incisions well-healed Cardiac regular rate and rhythm  Diagnostic Tests: CHEST - 2 VIEW  COMPARISON:  12/24/2018  FINDINGS: Persistent moderate right pleural effusion. The non obscured lungs are clear. Normal heart size. No pneumothorax.  IMPRESSION: Unchanged moderate right pleural effusion.   Electronically Signed   By: Monte Fantasia M.D.   On: 01/15/2019 09:09 I personally reviewed the chest x-ray images.  There is minimal if any effusion.  There are changes of a bilobectomy.  Impression: Susan Elliott is a 23 year old woman who presented with recurrent respiratory infections.  She was found to have an obstructing carcinoid tumor in her bronchus intermedius.  She had a resection about 3 months ago.  In the early postoperative period she had some mucus plugging and required bronchoscopy.  She initially had a lot of wheezing with exertion.  She also had an episode of hemoptysis about a month ago.  Currently she is doing well.  She is not having any  incisional pain.  Her respiratory status is improved.  She is not having any wheezing with exertion.  She has not had any further hemoptysis.  I do not think bronchoscopy is indicated at this time, but would have a low threshold to do so if she were to have any more hemoptysis or worsening wheezing.  There are no restrictions on her activities at this time.  Only question her case is when to return to work.  She works at [employer] as a Quarry manager.  She is not necessarily at increased risk for contracting COVID but would be at increased risk for respiratory failure were she to contract it.  We have tentatively set a date for January 28, 2019, but she will check with [facility] prior to returning to make sure there is not an outbreak at that time.  She has a stage I typical carcinoid tumor.  She is very low risk for recurrence.  Just for completeness sake, I am going to refer her to Dr. Julien Nordmann from oncology for his assessment.  Plan: Return in 3 months with PA and lateral chest x-ray  Melrose Nakayama, MD [facility] [phone]

## 2019-01-16 ENCOUNTER — Telehealth: Payer: Self-pay | Admitting: Internal Medicine

## 2019-01-16 ENCOUNTER — Encounter: Payer: Self-pay | Admitting: Internal Medicine

## 2019-01-16 NOTE — Telephone Encounter (Signed)
Received a new referral from Dr. Roxan Hockey for benign carcinoid tumor of the bronchus and lung. Pt has been cld and scheduled to see Dr. Julien Nordmann on 6/2 at 215pm w/labs at 145pm. I lft the appt date and time on the pt's vm. Letter mailed.

## 2019-01-16 NOTE — Telephone Encounter (Signed)
Pt cld to reschedule appt to 6/22 at 215pm w/labs at 145pm due to work schedule.

## 2019-01-29 ENCOUNTER — Other Ambulatory Visit: Payer: 59

## 2019-01-29 ENCOUNTER — Ambulatory Visit: Payer: 59 | Admitting: Internal Medicine

## 2019-02-07 ENCOUNTER — Encounter: Payer: Self-pay | Admitting: Thoracic Surgery (Cardiothoracic Vascular Surgery)

## 2019-02-14 ENCOUNTER — Telehealth: Payer: Self-pay | Admitting: Internal Medicine

## 2019-02-14 NOTE — Telephone Encounter (Signed)
Pt cld to reschedule her appt w/Dr. Julien Nordmann to 8/31 at 215pm w/labs at 145pm.

## 2019-02-18 ENCOUNTER — Other Ambulatory Visit: Payer: 59

## 2019-02-18 ENCOUNTER — Ambulatory Visit: Payer: 59 | Admitting: Internal Medicine

## 2019-03-07 ENCOUNTER — Other Ambulatory Visit: Payer: Self-pay | Admitting: Thoracic Surgery (Cardiothoracic Vascular Surgery)

## 2019-03-07 DIAGNOSIS — C7A090 Malignant carcinoid tumor of the bronchus and lung: Secondary | ICD-10-CM

## 2019-03-11 ENCOUNTER — Other Ambulatory Visit: Payer: Self-pay

## 2019-03-12 ENCOUNTER — Encounter: Payer: Self-pay | Admitting: Thoracic Surgery (Cardiothoracic Vascular Surgery)

## 2019-03-12 ENCOUNTER — Ambulatory Visit (INDEPENDENT_AMBULATORY_CARE_PROVIDER_SITE_OTHER): Payer: 59 | Admitting: Thoracic Surgery (Cardiothoracic Vascular Surgery)

## 2019-03-12 ENCOUNTER — Ambulatory Visit
Admission: RE | Admit: 2019-03-12 | Discharge: 2019-03-12 | Disposition: A | Discharge status: Home / Self Care | Payer: 59 | Source: Ambulatory Visit | Attending: Thoracic Surgery (Cardiothoracic Vascular Surgery) | Admitting: Thoracic Surgery (Cardiothoracic Vascular Surgery)

## 2019-03-12 VITALS — BP 131/77 | HR 118 | Temp 97.5°F | Resp 16 | Ht 67.0 in | Wt 247.0 lb

## 2019-03-12 DIAGNOSIS — D3A090 Benign carcinoid tumor of the bronchus and lung: Secondary | ICD-10-CM | POA: Diagnosis not present

## 2019-03-12 DIAGNOSIS — Z902 Acquired absence of lung [part of]: Secondary | ICD-10-CM | POA: Diagnosis not present

## 2019-03-12 DIAGNOSIS — C7A090 Malignant carcinoid tumor of the bronchus and lung: Secondary | ICD-10-CM

## 2019-03-12 NOTE — Progress Notes (Signed)
[address]       [place] [address]             [phone]       HPI: Ms. Sleeth returns for a scheduled follow-up visit  Susan Elliott is a 23 year old woman who presented with recurrent respiratory infections.  She had an obstructing endobronchial carcinoid tumor in the bronchus intermedius.  She had a right lower and middle lobectomy on 10/17/2018.  Initial postoperative course was complicated by mucous plugging.  In April she had an episode of hemoptysis.  I saw her shortly after that and the hemoptysis had resolved.  Saw her again in May and she had not had any further bleeding.  She feels better overall.  She occasionally notices some wheezing and tachycardia if she has been working hard.  She occasionally will have a sharp pain related to the incision when she takes a deep breath.  It does not happen all the time.  She does not have any paresthesias.  Past Medical History:  Diagnosis Date  . Asthma   . Celiac disease 2011  . Complication of anesthesia    migraine x3 days  . Dyspnea     1/ 23/2020- just since Ive had pneumonia  . Migraine   . Pneumonia 2019 aug  . Pneumonia due to Streptococcus pneumoniae ([place]) 08/09/2018    Current Outpatient Medications  Medication Sig Dispense Refill  . acetaminophen (TYLENOL) 325 MG tablet Take 650 mg by mouth every 6 (six) hours as needed (for pain.).    Marland Kitchen albuterol (PROVENTIL HFA;VENTOLIN HFA) 108 (90 Base) MCG/ACT inhaler Inhale 2 puffs into the lungs every 4 (four) hours as needed for wheezing or shortness of breath. 1 Inhaler 2  . benzonatate (TESSALON) 200 MG capsule Take 1 capsule (200 mg total) by mouth 3 (three) times daily as needed for cough. 45 capsule 1  . Biotin 5000 MCG TABS Take 5,000 mcg by mouth daily.    . cetirizine (ZYRTEC) 10 MG tablet Take 10 mg by mouth daily.    Marland Kitchen MUCINEX 600 MG 12 hr tablet TAKE 2 TABLETS (1,200 MG TOTAL) BY MOUTH 2 (TWO) TIMES DAILY. 40 tablet 5  . Multiple Vitamin  (MULTIVITAMIN WITH MINERALS) TABS tablet Take 1 tablet by mouth daily.    . Norethin Ace-Eth Estrad-FE 1-20 MG-MCG(24) CHEW CHEW 1 TABLET DAILY (Patient taking differently: Chew 1 tablet by mouth every evening. (1900)) 84 tablet 4   No current facility-administered medications for this visit.     Physical Exam BP 131/77 (BP Location: Left Arm, Patient Position: Sitting, Cuff Size: Large)   Pulse (!) 118   Temp (!) 97.5 F (36.4 C) Comment: THERMAL  Resp 16   Ht 5' 7"  (1.702 m)   Wt 247 lb (112 kg)   SpO2 97% Comment: RA  BMI 38.79 kg/m  23 year old woman in no acute distress Alert and oriented x3 with no focal deficits Lungs diminished at right base, otherwise clear, no wheezing Incisions well-healed Cardiac tachycardic, regular  Diagnostic Tests: CHEST - 2 VIEW  COMPARISON:  01/15/2019  FINDINGS: Cardiac shadow is stable. Left lung is clear. Stable right-sided pleural effusion is noted with postoperative changes in the right base consistent with the patient's given clinical history. No bony abnormality is noted.  IMPRESSION: Stable postoperative change with right pleural effusion. No acute abnormality noted.   Electronically Signed   By: Inez Catalina M.D.   On: 03/12/2019 09:32 I personally reviewed the  chest x-ray images.  Shows changes from bilobectomy with a tiny effusion.  Impression: Susan Elliott is a 23 year old young lady who presented with recurrent respiratory infections.  She was found to have an endobronchial carcinoid tumor in the bronchus intermedius.  She had a bilobectomy on 10/17/2018.  She had a lot of trouble with mucous plugging initially and had some narrowing at the airway closure.  She also had some hemoptysis about 4 to 6 weeks after the surgery.  No further hemoptysis since April.  She does have occasional wheezing when she is working really hard.  That resolves when she takes a break.  Her chest x-ray shows good aeration of the right  upper lobe.  She has not yet seen Dr. Julien Nordmann, but is scheduled to see him in August.  She does not need any adjuvant therapy.  There are no restrictions on her activities.  She does work in a nursing home.  Fortunately, none of the patient's there have tested positive for COVID.  I did warn her and she is well aware that if she were to get COVID that her pulmonary reserve is less than normal.  If there is an outbreak I think she should definitely stay out of work.  Plan: Consultation with Dr. Julien Nordmann is scheduled Return in 3 months with PA lateral chest x-ray  Melrose Nakayama, MD [facility] [phone]

## 2019-04-10 ENCOUNTER — Ambulatory Visit (INDEPENDENT_AMBULATORY_CARE_PROVIDER_SITE_OTHER): Payer: 59 | Admitting: Allergy & Immunology

## 2019-04-10 ENCOUNTER — Encounter: Payer: Self-pay | Admitting: Allergy & Immunology

## 2019-04-10 ENCOUNTER — Other Ambulatory Visit: Payer: Self-pay

## 2019-04-10 VITALS — BP 130/88 | HR 105 | Temp 97.8°F | Resp 18 | Ht 67.5 in | Wt 237.8 lb

## 2019-04-10 DIAGNOSIS — L508 Other urticaria: Secondary | ICD-10-CM | POA: Diagnosis not present

## 2019-04-10 NOTE — Patient Instructions (Addendum)
1. Chronic urticaria versus contact dermatitis - Your history does not have any "red flags" such as fevers, joint pains, or permanent skin changes that would be concerning for a more serious cause of hives.  - We will get some labs to rule out serious causes of hives: complete blood count, tryptase level, chronic urticaria panel, CMP, ESR, and CRP.   - We are going to get some more food testing and an alpha gal panel to look for a red meat sensitivity.  - We will call you in 1-2 weeks with the results of the testing. - Consider patch testing to look for chemical sensitivities.  - Testing was negative to the entire environmental allergy panel. - Testing was negative to the most common foods, which rules out 96% of all food allergies. - Chronic hives are often times a self limited process and will "burn themselves out" over 6-12 months, although this is not always the case.  - In the meantime, start suppressive dosing of antihistamines:   - Morning: Zyrtec (cetirizine) 10-'20mg'$  (one to two tablets)  - Evening: Zyrtec (cetirizine) 10-'20mg'$  (one to two tablets) - You can change this dosing at home, decreasing the dose as needed or increasing the dosing as needed.   2. Return in about 2 months (around 06/10/2019). This can be an in-person, a virtual Webex or a telephone follow up visit.   Please inform us of any Emergency Department visits, hospitalizations, or changes in symptoms. Call us before going to the ED for breathing or allergy symptoms since we might be able to fit you in for a sick visit. Feel free to contact us anytime with any questions, problems, or concerns.  It was a pleasure to meet you today!  Websites that have reliable patient information: 1. American Academy of Asthma, Allergy, and Immunology: www.aaaai.org 2. Food Allergy Research and Education (FARE): foodallergy.org 3. Mothers of Asthmatics: http://www.asthmacommunitynetwork.org 4. American College of Allergy, Asthma, and  Immunology: www.acaai.org  "Like" [place] on Facebook and Instagram for our latest updates!      Make sure you are registered to vote! If you have moved or changed any of your contact information, you will need to get this updated before voting!  In some cases, you MAY be able to register to vote online: [email or web address]    Voter ID laws are NOT going into effect for the General Election in November 2020! DO NOT let this stop you from exercising your right to vote!   Absentee voting is the SAFEST way to vote during the coronavirus pandemic!   Download and print an absentee ballot request form at [email or web address] or you can scan the QR code below with your smart phone:      More information on absentee ballots can be found here: [email or web address]

## 2019-04-10 NOTE — Progress Notes (Signed)
NEW PATIENT  Date of Service/Encounter:  04/10/19  Referring provider: [name], PA-C   Assessment:   Chronic urticaria versus contact dermatitis  Plan/Recommendations:   1. Chronic urticaria versus contact dermatitis - Your history does not have any "red flags" such as fevers, joint pains, or permanent skin changes that would be concerning for a more serious cause of hives.  - We will get some labs to rule out serious causes of hives: complete blood count, tryptase level, chronic urticaria panel, CMP, ESR, and CRP.   - We are going to get some more food testing and an alpha gal panel to look for a red meat sensitivity.  - We will call you in 1-2 weeks with the results of the testing. - Consider patch testing to look for chemical sensitivities.  - Testing was negative to the entire environmental allergy panel. - Testing was negative to the most common foods, which rules out 96% of all food allergies. - Chronic hives are often times a self limited process and will "burn themselves out" over 6-12 months, although this is not always the case.  - In the meantime, start suppressive dosing of antihistamines:   - Morning: Zyrtec (cetirizine) 10-'20mg'$  (one to two tablets)  - Evening: Zyrtec (cetirizine) 10-'20mg'$  (one to two tablets) - You can change this dosing at home, decreasing the dose as needed or increasing the dosing as needed.   2. Return in about 2 months (around 06/10/2019). This can be an in-person, a virtual Webex or a telephone follow up visit.   Subjective:   Susan Elliott is a 23 y.o. female presenting today for evaluation of  Chief Complaint  Patient presents with  . Rash  . Urticaria    Susan Elliott has a history of the following: Patient Active Problem List   Diagnosis Date Noted  . 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 Pressure injury of skin 10/18/2018  . S/P Right Middle Lobectomy, Right Lower Lobectomy 10/17/2018  .  Elevated diaphragm 09/14/2018  . Right middle lobe pneumonia ([place]) 09/14/2018  . Chronic cough 08/09/2018    History obtained from: chart review and patient.  Susan Elliott was referred by Susan Stalker, PA-C.     Susan Elliott is a 23 y.o. female presenting for an evaluation of a rash. This started around the beginning of July. She thought it was related to stress initially since she was only back at work for around one month at that point. It starts with itching all over and then the rash takes a week or some for the rash to start.  She was placed a six day taper of prednisone which helped completely. This was during the 3rd week of July or so. She does do walking. She does have two dogs, but they are not new. They do spend some time outdoors but it is in the grass and not the woods. They live in an apartment complex. She was on prednisone on two occasions. She finished last Friday and she remains itchy.  She was thinking it might be related to elderberry. She stopped it on Saturday to see if this would do anything. She was itchy last night. She is not on any new medications postop. She does take Mucinex for congestion.   She has never been tested for allergies. She does take Zyrtec and now changed to Claritin to see if that would help. She has avoided it for three day. Taking a daily antihistamine did  help with the itching. She has bene using Benadryl at night. She has not been using any scented ointments at all, although before this she was using [employer] products occasionally. She is now using [employer] products.   She is a Quarry manager at [employer]. There are no COVID cases there, thankfully.    She has a history carcinoid syndrome that was within her lungs. She presented with recurrent pneumonia and she actually to demand a chest CT. She is breathing well without any problems at all. This was all considered benign but she had to remove two lobes of her lungs.   Otherwise, there is no  history of other atopic diseases, including asthma, food allergies, drug allergies or stinging insect allergies. There is no significant infectious history. Vaccinations are up to date.    Past Medical History: Patient Active Problem List   Diagnosis Date Noted  . 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 Pressure injury of skin 10/18/2018  . S/P Right Middle Lobectomy, Right Lower Lobectomy 10/17/2018  . Elevated diaphragm 09/14/2018  . Right middle lobe pneumonia ([place]) 09/14/2018  . Chronic cough 08/09/2018    Medication List:  Allergies as of 04/10/2019      Reactions   Penicillins Rash, Other (See Comments)   Did it involve swelling of the face/tongue/throat, SOB, or low BP? No Did it involve sudden or severe rash/hives, skin peeling, or any reaction on the inside of your mouth or nose? #  #  #  YES  #  #  #  Did you need to seek medical attention at a hospital or doctor's office? #  #  #  YES  #  #  #  When did it last happen?Childhood allergy   Gluten Meal Other (See Comments)   Celiac disease   Ultram [tramadol Hcl]    migraine      Medication List       Accurate as of April 10, 2019 11:46 AM. If you have any questions, ask your nurse or doctor.        acetaminophen 325 MG tablet Commonly known as: TYLENOL Take 650 mg by mouth every 6 (six) hours as needed (for pain.).   albuterol 108 (90 Base) MCG/ACT inhaler Commonly known as: VENTOLIN HFA Inhale 2 puffs into the lungs every 4 (four) hours as needed for wheezing or shortness of breath.   benzonatate 200 MG capsule Commonly known as: TESSALON Take 1 capsule (200 mg total) by mouth 3 (three) times daily as needed for cough.   Biotin 5000 MCG Tabs Take 5,000 mcg by mouth daily.   cetirizine 10 MG tablet Commonly known as: ZYRTEC Take 10 mg by mouth daily.   Mucinex 600 MG 12 hr tablet Generic drug: guaiFENesin TAKE 2 TABLETS (1,200 MG TOTAL) BY MOUTH 2 (TWO) TIMES DAILY.    multivitamin with minerals Tabs tablet Take 1 tablet by mouth daily.   Norethin Ace-Eth Estrad-FE 1-20 MG-MCG(24) Chew CHEW 1 TABLET DAILY What changed:   how much to take  how to take this  when to take this  additional instructions   triamcinolone ointment 0.1 % Commonly known as: KENALOG APPLY TO AFFECTED AREA TWICE A DAY FOR 10 DAYS       Birth History: non-contributory  Developmental History: non-contributory  Past Surgical History: Past Surgical History:  Procedure Laterality Date  . CHOLECYSTECTOMY    . IR THORACENTESIS ASP PLEURAL SPACE W/IMG GUIDE  10/31/2018  . LOBECTOMY N/A  10/17/2018   Procedure: Right middle and lower  BILOBECTOMY;  Surgeon: Melrose Nakayama, MD;  Location: [facility];  Service: Thoracic;  Laterality: N/A;  . VIDEO ASSISTED THORACOSCOPY Right 10/17/2018   Procedure: VIDEO ASSISTED THORACOSCOPY with medial lymph node dissection and intercostal nerve block;  Surgeon: Melrose Nakayama, MD;  Location: [facility];  Service: Thoracic;  Laterality: Right;  Marland Kitchen VIDEO BRONCHOSCOPY N/A 09/21/2018   Procedure: VIDEO BRONCHOSCOPY WITH FORCEPS BIOPSY, right middle lung lobe;  Surgeon: Margaretha Seeds, MD;  Location: [facility];  Service: Thoracic;  Laterality: N/A;  . VIDEO BRONCHOSCOPY N/A 10/17/2018   Procedure: VIDEO BRONCHOSCOPY;  Surgeon: Melrose Nakayama, MD;  Location: [facility];  Service: Thoracic;  Laterality: N/A;  . VIDEO BRONCHOSCOPY N/A 10/22/2018   Procedure: VIDEO BRONCHOSCOPY;  Surgeon: Melrose Nakayama, MD;  Location: [facility] OR;  Service: Thoracic;  Laterality: N/A;     Family History: Family History  Problem Relation Age of Onset  . Breast cancer Paternal Aunt   . Cancer Maternal Grandfather        lung     Social History: Bennette lives at home with her mother.  They live in an apartment that is 43-year-old.  There is vinyl in the main living areas and carpeting in the bedrooms.  They have electric heating and central cooling.  There are 2  dogs in the home who have been there for quite some time.  There are no dust mite covers on the mattress, but her mattress is new as of July 2020.  There is no tobacco exposure.  She currently works as a Marine scientist at [employer].   Review of Systems  Constitutional: Negative.  Negative for fever, malaise/fatigue and weight loss.  HENT: Negative.  Negative for congestion, ear discharge and ear pain.   Eyes: Negative for pain, discharge and redness.  Respiratory: Negative for cough, sputum production, shortness of breath and wheezing.        Baseline shortness of breath following her surgery.   Cardiovascular: Negative.  Negative for chest pain and palpitations.  Gastrointestinal: Negative for abdominal pain, constipation, diarrhea, heartburn, nausea and vomiting.  Skin: Positive for rash. Negative for itching.  Neurological: Negative for dizziness and headaches.  Endo/Heme/Allergies: Negative for environmental allergies. Does not bruise/bleed easily.       Objective:   Blood pressure 130/88, pulse (!) 105, temperature 97.8 F (36.6 C), resp. rate 18, height 5' 7.5" (1.715 m), weight 237 lb 12.8 oz (107.9 kg), SpO2 99 %. Body mass index is 36.7 kg/m.   Physical Exam:   Physical Exam  Constitutional: She appears well-developed.  Obese female. Very talkative and pleasant.   HENT:  Head: Normocephalic and atraumatic.  Right Ear: Tympanic membrane, external ear and ear canal normal. No drainage, swelling or tenderness. Tympanic membrane is not injected, not scarred, not erythematous, not retracted and not bulging.  Left Ear: Tympanic membrane, external ear and ear canal normal. No drainage, swelling or tenderness. Tympanic membrane is not injected, not scarred, not erythematous, not retracted and not bulging.  Nose: Mucosal edema present. No rhinorrhea, nasal deformity or septal deviation. No epistaxis. Right sinus exhibits no maxillary sinus tenderness and no frontal sinus tenderness. Left  sinus exhibits no maxillary sinus tenderness and no frontal sinus tenderness.  Mouth/Throat: Uvula is midline and oropharynx is clear and moist. Mucous membranes are not pale and not dry.  Eyes: Pupils are equal, round, and reactive to light. Conjunctivae and EOM are normal. Right eye exhibits  no chemosis and no discharge. Left eye exhibits no chemosis and no discharge. Right conjunctiva is not injected. Left conjunctiva is not injected.  Cardiovascular: Normal rate, regular rhythm and normal heart sounds.  Respiratory: Effort normal and breath sounds normal. No accessory muscle usage. No tachypnea. No respiratory distress. She has no wheezes. She has no rhonchi. She has no rales. She exhibits no tenderness.  Absent air movement at the bases on the right side.   GI: There is no abdominal tenderness. There is no rebound and no guarding.  Lymphadenopathy:       Head (right side): No submandibular, no tonsillar and no occipital adenopathy present.       Head (left side): No submandibular, no tonsillar and no occipital adenopathy present.    She has no cervical adenopathy.  Neurological: She is alert.  Skin: No abrasion, no petechiae and no rash noted. Rash is not papular, not vesicular and not urticarial. No erythema. No pallor.  There are some picked lesions that are healing on the bilateral arms. There are some on the legs as well. No eschar or draining lesions noted.  Psychiatric: She has a normal mood and affect.     Diagnostic studies:      Allergy Studies:    Airborne Adult Perc - 04/10/19 0955    Time Antigen Placed  0955    Allergen Manufacturer  Lavella Hammock    Location  Back    Number of Test  59    Panel 1  Select    1. Control-Buffer 50% Glycerol  Negative    2. Control-Histamine 1 mg/ml  2+    3. Albumin saline  Negative    4. Jasper  Negative    5. [place]  Negative    6. Johnson  Negative    7. Ramsey Blue  Negative    8. Meadow Fescue  Negative    9. Perennial Rye   Negative    10. Sweet Vernal  Negative    11. Timothy  Negative    12. Cocklebur  Negative    13. Burweed Marshelder  Negative    14. Ragweed, short  Negative    15. Ragweed, Giant  Negative    16. Plantain,  English  Negative    17. Lamb's Quarters  Negative    18. Sheep Sorrell  Negative    19. Rough Pigweed  Negative    20. Marsh Elder, Rough  Negative    21. Mugwort, Common  Negative    22. Ash mix  Negative    23. Birch mix  Negative    24. Beech American  Negative    25. Box, Elder  Negative    26. Cedar, red  Negative    27. Cottonwood, [place]  Negative    28. Elm mix  Negative    29. Hickory mix  Negative    30. Maple mix  Negative    31. Oak, [place] mix  Negative    32. Pecan Pollen  Negative    33. Pine mix  Negative    34. Sycamore Eastern  Negative    35. [place], Black Pollen  Negative    36. Alternaria alternata  Negative    37. Cladosporium Herbarum  Negative    38. Aspergillus mix  Negative    39. Penicillium mix  Negative    40. Bipolaris sorokiniana (Helminthosporium)  Negative    41. Drechslera spicifera (Curvularia)  Negative    42. Mucor plumbeus  Negative  43. Fusarium moniliforme  Negative    44. Aureobasidium pullulans (pullulara)  Negative    45. Rhizopus oryzae  Negative    46. Botrytis cinera  Negative    47. Epicoccum nigrum  Negative    48. Phoma betae  Negative    49. Candida Albicans  Negative    50. Trichophyton mentagrophytes  Negative    51. Mite, D Farinae  5,000 AU/ml  Negative    52. Mite, D Pteronyssinus  5,000 AU/ml  Negative    53. Cat Hair 10,000 BAU/ml  Negative    54.  Dog Epithelia  Negative    55. Mixed Feathers  Negative    56. Horse Epithelia  Negative    57. Cockroach, German  Negative    58. Mouse  Negative    59. Tobacco Leaf  Negative     Food Perc - 04/10/19 0955    Time Antigen Placed  2876    Allergen Manufacturer  Lavella Hammock    Location  Back    Number of allergen test  10    Food  Select    1. Peanut   Negative    2. Soybean food  Negative    3. Wheat, whole  Negative    4. Sesame  Negative    5. Milk, cow  Negative    6. Egg White, chicken  Negative    7. Casein  Negative    8. Shellfish mix  Negative    9. Fish mix  Negative    10. Cashew  Negative       Allergy testing results were read and interpreted by myself, documented by clinical staff.         Salvatore Marvel, MD [facility]

## 2019-04-17 LAB — CBC WITH DIFFERENTIAL
Basophils Absolute: 0 10*3/uL (ref 0.0–0.2)
Basos: 0 %
EOS (ABSOLUTE): 0.1 10*3/uL (ref 0.0–0.4)
Eos: 1 %
Hematocrit: 42.4 % (ref 34.0–46.6)
Hemoglobin: 14.3 g/dL (ref 11.1–15.9)
Immature Grans (Abs): 0 10*3/uL (ref 0.0–0.1)
Immature Granulocytes: 0 %
Lymphocytes Absolute: 1.8 10*3/uL (ref 0.7–3.1)
Lymphs: 19 %
MCH: 28.1 pg (ref 26.6–33.0)
MCHC: 33.7 g/dL (ref 31.5–35.7)
MCV: 83 fL (ref 79–97)
Monocytes Absolute: 0.5 10*3/uL (ref 0.1–0.9)
Monocytes: 5 %
Neutrophils Absolute: 7.1 10*3/uL — ABNORMAL HIGH (ref 1.4–7.0)
Neutrophils: 75 %
RBC: 5.09 x10E6/uL (ref 3.77–5.28)
RDW: 14.2 % (ref 11.7–15.4)
WBC: 9.5 10*3/uL (ref 3.4–10.8)

## 2019-04-17 LAB — CHRONIC URTICARIA: cu index: 3 (ref <10)

## 2019-04-17 LAB — ALLERGEN PROFILE, BASIC FOOD
Allergen Corn, IgE: <0.1 kU/L
Beef IgE: <0.1 kU/L
Chocolate/Cacao IgE: <0.1 kU/L
Egg, Whole IgE: <0.1 kU/L
Food Mix (Seafoods) IgE: NEGATIVE
Milk IgE: <0.1 kU/L
Peanut IgE: <0.1 kU/L
Pork IgE: <0.1 kU/L
Soybean IgE: <0.1 kU/L
Wheat IgE: <0.1 kU/L

## 2019-04-17 LAB — IGE+ALLERGENS ZONE 2(30)
Alternaria Alternata IgE: <0.1 kU/L
Amer Sycamore IgE Qn: <0.1 kU/L
Aspergillus Fumigatus IgE: <0.1 kU/L
Bahia Grass IgE: <0.1 kU/L
Bermuda Grass IgE: <0.1 kU/L
Cat Dander IgE: <0.1 kU/L
Cedar, Mountain IgE: <0.1 kU/L
Cladosporium Herbarum IgE: <0.1 kU/L
Cockroach, American IgE: <0.1 kU/L
Common Silver Birch IgE: <0.1 kU/L
D Farinae IgE: <0.1 kU/L
D Pteronyssinus IgE: <0.1 kU/L
Dog Dander IgE: <0.1 kU/L
Elm, American IgE: <0.1 kU/L
Hickory, White IgE: <0.1 kU/L
IgE (Immunoglobulin E), Serum: 5 IU/mL — ABNORMAL LOW (ref 6–495)
Johnson Grass IgE: <0.1 kU/L
Maple/Box Elder IgE: <0.1 kU/L
Mucor Racemosus IgE: <0.1 kU/L
Mugwort IgE Qn: <0.1 kU/L
Nettle IgE: <0.1 kU/L
Oak, White IgE: <0.1 kU/L
Penicillium Chrysogen IgE: <0.1 kU/L
Pigweed, Rough IgE: <0.1 kU/L
Plantain, English IgE: <0.1 kU/L
Ragweed, Short IgE: <0.1 kU/L
Sheep Sorrel IgE Qn: <0.1 kU/L
Stemphylium Herbarum IgE: <0.1 kU/L
Sweet gum IgE RAST Ql: <0.1 kU/L
Timothy Grass IgE: <0.1 kU/L
White Mulberry IgE: <0.1 kU/L

## 2019-04-17 LAB — CMP14+EGFR
ALT: 13 IU/L (ref 0–32)
AST: 16 IU/L (ref 0–40)
Albumin/Globulin Ratio: 1.7 (ref 1.2–2.2)
Albumin: 4.3 g/dL (ref 3.9–5.0)
Alkaline Phosphatase: 77 IU/L (ref 39–117)
BUN/Creatinine Ratio: 12 (ref 9–23)
BUN: 7 mg/dL (ref 6–20)
Bilirubin Total: 0.3 mg/dL (ref 0.0–1.2)
CO2: 22 mmol/L (ref 20–29)
Calcium: 9.5 mg/dL (ref 8.7–10.2)
Chloride: 102 mmol/L (ref 96–106)
Creatinine, Ser: 0.58 mg/dL (ref 0.57–1.00)
GFR calc Af Amer: 149 mL/min/{1.73_m2} (ref >59)
GFR calc non Af Amer: 129 mL/min/{1.73_m2} (ref >59)
Globulin, Total: 2.5 g/dL (ref 1.5–4.5)
Glucose: 87 mg/dL (ref 65–99)
Potassium: 4.1 mmol/L (ref 3.5–5.2)
Sodium: 138 mmol/L (ref 134–144)
Total Protein: 6.8 g/dL (ref 6.0–8.5)

## 2019-04-17 LAB — ALPHA-GAL PANEL
Alpha Gal IgE*: <0.1 kU/L (ref <0.10)
Beef (Bos spp) IgE: <0.1 kU/L (ref <0.35)
Class Interpretation: 0
Class Interpretation: 0
Class Interpretation: 0
Lamb/Mutton (Ovis spp) IgE: <0.1 kU/L (ref <0.35)
Pork (Sus spp) IgE: <0.1 kU/L (ref <0.35)

## 2019-04-17 LAB — THYROID ANTIBODIES
Thyroglobulin Antibody: <1 IU/mL (ref 0.0–0.9)
Thyroperoxidase Ab SerPl-aCnc: <9 IU/mL (ref 0–34)

## 2019-04-17 LAB — ALLERGEN PROFILE, MOLD
Aureobasidi Pullulans IgE: <0.1 kU/L
Candida Albicans IgE: <0.1 kU/L
M009-IgE Fusarium proliferatum: <0.1 kU/L
M014-IgE Epicoccum purpur: <0.1 kU/L
Phoma Betae IgE: <0.1 kU/L
Setomelanomma Rostrat: <0.1 kU/L

## 2019-04-17 LAB — TRYPTASE: Tryptase: 7.9 ug/L (ref 2.2–13.2)

## 2019-04-17 LAB — ANA W/REFLEX IF POSITIVE: Anti Nuclear Antibody (ANA): NEGATIVE

## 2019-04-17 LAB — C-REACTIVE PROTEIN: CRP: 17 mg/L — ABNORMAL HIGH (ref 0–10)

## 2019-04-17 LAB — SEDIMENTATION RATE: Sed Rate: 10 mm/hr (ref 0–32)

## 2019-04-26 ENCOUNTER — Other Ambulatory Visit: Payer: Self-pay | Admitting: *Deleted

## 2019-04-26 DIAGNOSIS — C7A090 Malignant carcinoid tumor of the bronchus and lung: Secondary | ICD-10-CM

## 2019-04-29 ENCOUNTER — Inpatient Hospital Stay: Payer: 59 | Attending: Internal Medicine | Admitting: Internal Medicine

## 2019-04-29 ENCOUNTER — Inpatient Hospital Stay: Payer: 59

## 2019-04-29 ENCOUNTER — Encounter: Payer: Self-pay | Admitting: Internal Medicine

## 2019-04-29 ENCOUNTER — Other Ambulatory Visit: Payer: Self-pay

## 2019-04-29 VITALS — BP 124/94 | HR 92 | Temp 98.0°F | Resp 18 | Ht 67.5 in | Wt 244.7 lb

## 2019-04-29 DIAGNOSIS — Z801 Family history of malignant neoplasm of trachea, bronchus and lung: Secondary | ICD-10-CM

## 2019-04-29 DIAGNOSIS — Z902 Acquired absence of lung [part of]: Secondary | ICD-10-CM | POA: Diagnosis not present

## 2019-04-29 DIAGNOSIS — Z803 Family history of malignant neoplasm of breast: Secondary | ICD-10-CM | POA: Diagnosis not present

## 2019-04-29 DIAGNOSIS — R053 Chronic cough: Secondary | ICD-10-CM

## 2019-04-29 DIAGNOSIS — C7A090 Malignant carcinoid tumor of the bronchus and lung: Secondary | ICD-10-CM | POA: Insufficient documentation

## 2019-04-29 DIAGNOSIS — R05 Cough: Secondary | ICD-10-CM

## 2019-04-29 DIAGNOSIS — K900 Celiac disease: Secondary | ICD-10-CM

## 2019-04-29 DIAGNOSIS — C3490 Malignant neoplasm of unspecified part of unspecified bronchus or lung: Secondary | ICD-10-CM

## 2019-04-29 LAB — CMP (CANCER CENTER ONLY)
ALT: 12 U/L (ref 0–44)
AST: 12 U/L — ABNORMAL LOW (ref 15–41)
Albumin: 3.7 g/dL (ref 3.5–5.0)
Alkaline Phosphatase: 69 U/L (ref 38–126)
Anion gap: 10 (ref 5–15)
BUN: 8 mg/dL (ref 6–20)
CO2: 23 mmol/L (ref 22–32)
Calcium: 9.2 mg/dL (ref 8.9–10.3)
Chloride: 107 mmol/L (ref 98–111)
Creatinine: 0.82 mg/dL (ref 0.44–1.00)
GFR, Est AFR Am: >60 mL/min
GFR, Estimated: >60 mL/min
Glucose, Bld: 93 mg/dL (ref 70–99)
Potassium: 3.8 mmol/L (ref 3.5–5.1)
Sodium: 140 mmol/L (ref 135–145)
Total Bilirubin: <0.2 mg/dL — ABNORMAL LOW (ref 0.3–1.2)
Total Protein: 7 g/dL (ref 6.5–8.1)

## 2019-04-29 LAB — CBC WITH DIFFERENTIAL (CANCER CENTER ONLY)
Abs Immature Granulocytes: 0.03 10*3/uL (ref 0.00–0.07)
Basophils Absolute: 0 10*3/uL (ref 0.0–0.1)
Basophils Relative: 0 %
Eosinophils Absolute: 0.2 10*3/uL (ref 0.0–0.5)
Eosinophils Relative: 2 %
HCT: 41.6 % (ref 36.0–46.0)
Hemoglobin: 13.7 g/dL (ref 12.0–15.0)
Immature Granulocytes: 0 %
Lymphocytes Relative: 33 %
Lymphs Abs: 2.9 10*3/uL (ref 0.7–4.0)
MCH: 27.6 pg (ref 26.0–34.0)
MCHC: 32.9 g/dL (ref 30.0–36.0)
MCV: 83.9 fL (ref 80.0–100.0)
Monocytes Absolute: 0.4 10*3/uL (ref 0.1–1.0)
Monocytes Relative: 5 %
Neutro Abs: 5.2 10*3/uL (ref 1.7–7.7)
Neutrophils Relative %: 60 %
Platelet Count: 317 10*3/uL (ref 150–400)
RBC: 4.96 MIL/uL (ref 3.87–5.11)
RDW: 13.9 % (ref 11.5–15.5)
WBC Count: 8.7 10*3/uL (ref 4.0–10.5)
nRBC: 0 % (ref 0.0–0.2)

## 2019-04-29 NOTE — Progress Notes (Signed)
[facility] [phone]   [phone]  CONSULT NOTE  REFERRING PHYSICIAN: Dr. Modesto Charon  REASON FOR CONSULTATION:  23 years old white female recently diagnosed with carcinoid tumor.  HPI Susan Elliott is a 23 y.o. female with past medical history significant for celiac disease as well as migraine headache and recurrent pneumonia.  The patient mentions that she had several episodes of pneumonia since July 2019.  She was seen by her primary care physician several times with no improvement.  She was initially treated for asthma.  She had CT scan of the chest without contrast on September 18, 2018 and that showed 1.8 cm oval soft tissue structure within the right bronchus intermedius suspicious for an endobronchial mass with associated atelectasis/collapse of the right lower and middle lobes.  There was no abnormal appearing lymph nodes.  The patient was referred to pulmonary medicine and seen by Dr. Loanne Drilling.  She had flexible video fiberoptic bronchoscopy by Dr. Loanne Drilling on September 21, 2018.  The bronchoscopy showed right middle lobe occlusion of endobronchial mass.  The airway was occluded 90%.  Biopsies were performed and the final pathology  ([id number]) showed no definitive malignancy identified.  A PET dotatate scan was performed on October 09, 2018 and that showed intense radiotracer activity associated with the endobronchial lesion in the right bronchus intermedius consistent with well-differentiated neuroendocrine tumor.  There was no evidence of metastatic neuroendocrine tumor on the remaining portion of the scan.  There was also postobstructive collapse of the right lower lobe increased from previous exam.  The patient was referred to Dr. Roxan Hockey and on October 17, 2018 she underwent right VATS, right lower and middle bilobectomy with mediastinal lymph node dissection.  The final pathology ([id number]) was consistent with low-grade neuroendocrine  tumor, carcinoid tumor measuring 1.8 cm with negative resection margin as well as negative dissected lymphadenopathy.  There was focal lymphovascular invasion and no visceral pleural invasion. The patient was referred to me today by Dr. Roxan Hockey for further evaluation and follow-up for her previously resected carcinoid tumor. When seen today the patient is feeling fine except for intermittent pain on the right side of the chest increased with deep breathing as well as shortness of breath with exertion and mild cough.  She denied having any recent weight loss or night sweats.  She denied having any nausea, vomiting but has occasional diarrhea secondary to her history of celiac disease and no constipation or melena.  She denied having any visual changes but has intermittent migraine headache. Family history significant for mother and father are healthy.  Paternal aunt had breast cancer and maternal grandfather had lung cancer. The patient is single and has no children she works at [employer] at [employer].  She has no history for smoking but drinks alcohol occasionally and no history of drug abuse.    HPI  Past Medical History:  Diagnosis Date  . Asthma   . Celiac disease 2011  . Complication of anesthesia    migraine x3 days  . Dyspnea     1/ 23/2020- just since Ive had pneumonia  . Migraine   . Pneumonia 2019 aug  . Pneumonia due to Streptococcus pneumoniae ([place]) 08/09/2018    Past Surgical History:  Procedure Laterality Date  . CHOLECYSTECTOMY    . IR THORACENTESIS ASP PLEURAL SPACE W/IMG GUIDE  10/31/2018  . LOBECTOMY N/A 10/17/2018   Procedure: Right middle and lower  BILOBECTOMY;  Surgeon: Melrose Nakayama, MD;  Location: [facility]  OR;  Service: Thoracic;  Laterality: N/A;  . VIDEO ASSISTED THORACOSCOPY Right 10/17/2018   Procedure: VIDEO ASSISTED THORACOSCOPY with medial lymph node dissection and intercostal nerve block;  Surgeon: Melrose Nakayama, MD;  Location: [facility];  Service:  Thoracic;  Laterality: Right;  Marland Kitchen VIDEO BRONCHOSCOPY N/A 09/21/2018   Procedure: VIDEO BRONCHOSCOPY WITH FORCEPS BIOPSY, right middle lung lobe;  Surgeon: Margaretha Seeds, MD;  Location: [facility];  Service: Thoracic;  Laterality: N/A;  . VIDEO BRONCHOSCOPY N/A 10/17/2018   Procedure: VIDEO BRONCHOSCOPY;  Surgeon: Melrose Nakayama, MD;  Location: [facility];  Service: Thoracic;  Laterality: N/A;  . VIDEO BRONCHOSCOPY N/A 10/22/2018   Procedure: VIDEO BRONCHOSCOPY;  Surgeon: Melrose Nakayama, MD;  Location: [facility] OR;  Service: Thoracic;  Laterality: N/A;    Family History  Problem Relation Age of Onset  . Breast cancer Paternal Aunt   . Cancer Maternal Grandfather        lung    Social History Social History   Tobacco Use  . Smoking status: Never Smoker  . Smokeless tobacco: Never Used  Substance Use Topics  . Alcohol use: Yes    Comment: OCC  . Drug use: Never    Allergies  Allergen Reactions  . Penicillins Rash and Other (See Comments)    Did it involve swelling of the face/tongue/throat, SOB, or low BP? No Did it involve sudden or severe rash/hives, skin peeling, or any reaction on the inside of your mouth or nose? #  #  #  YES  #  #  #  Did you need to seek medical attention at a hospital or doctor's office? #  #  #  YES  #  #  #  When did it last happen?Childhood allergy   . Gluten Meal Other (See Comments)    Celiac disease  . Ultram [Tramadol Hcl]     migraine    Current Outpatient Medications  Medication Sig Dispense Refill  . acetaminophen (TYLENOL) 325 MG tablet Take 650 mg by mouth every 6 (six) hours as needed (for pain.).    Marland Kitchen albuterol (PROVENTIL HFA;VENTOLIN HFA) 108 (90 Base) MCG/ACT inhaler Inhale 2 puffs into the lungs every 4 (four) hours as needed for wheezing or shortness of breath. 1 Inhaler 2  . Biotin 5000 MCG TABS Take 5,000 mcg by mouth daily.    . cephALEXin (KEFLEX) 500 MG capsule Take 500 mg by mouth 2 (two) times daily. Taking for 7 days.  On  day 3 (04/29/19)    . cetirizine (ZYRTEC) 10 MG tablet Take 10 mg by mouth daily.    Marland Kitchen MUCINEX 600 MG 12 hr tablet TAKE 2 TABLETS (1,200 MG TOTAL) BY MOUTH 2 (TWO) TIMES DAILY. 40 tablet 5  . Multiple Vitamin (MULTIVITAMIN WITH MINERALS) TABS tablet Take 1 tablet by mouth daily.    . Norethin Ace-Eth Estrad-FE 1-20 MG-MCG(24) CHEW CHEW 1 TABLET DAILY (Patient taking differently: Chew 1 tablet by mouth every evening. (1900)) 84 tablet 4  . benzonatate (TESSALON) 200 MG capsule Take 1 capsule (200 mg total) by mouth 3 (three) times daily as needed for cough. (Patient not taking: Reported on 04/29/2019) 45 capsule 1  . triamcinolone ointment (KENALOG) 0.1 % APPLY TO AFFECTED AREA TWICE A DAY FOR 10 DAYS     No current facility-administered medications for this visit.     Review of Systems  Constitutional: negative Eyes: negative Ears, nose, mouth, throat, and face: negative Respiratory: positive for cough, dyspnea on  exertion and pleurisy/chest pain Cardiovascular: negative Gastrointestinal: negative Genitourinary:negative Integument/breast: negative Hematologic/lymphatic: negative Musculoskeletal:negative Neurological: negative Behavioral/Psych: negative Endocrine: negative Allergic/Immunologic: negative  Physical Exam  IWP:YKDXI, healthy, no distress, well nourished and well developed SKIN: skin color, texture, turgor are normal, no rashes or significant lesions HEAD: Normocephalic, No masses, lesions, tenderness or abnormalities EYES: normal, PERRLA, Conjunctiva are pink and non-injected EARS: External ears normal, Canals clear OROPHARYNX:no exudate, no erythema and lips, buccal mucosa, and tongue normal  NECK: supple, no adenopathy, no JVD LYMPH:  no palpable lymphadenopathy, no hepatosplenomegaly BREAST:not examined LUNGS: clear to auscultation , and palpation HEART: regular rate & rhythm, no murmurs and no gallops ABDOMEN:abdomen soft, non-tender, normal bowel sounds and no  masses or organomegaly BACK: No CVA tenderness, Range of motion is normal EXTREMITIES:no joint deformities, effusion, or inflammation, no edema  NEURO: alert & oriented x 3 with fluent speech, no focal motor/sensory deficits  PERFORMANCE STATUS: ECOG 1  LABORATORY DATA: Lab Results  Component Value Date   WBC 8.7 04/29/2019   HGB 13.7 04/29/2019   HCT 41.6 04/29/2019   MCV 83.9 04/29/2019   PLT 317 04/29/2019      Chemistry      Component Value Date/Time   NA 138 04/10/2019 1115   K 4.1 04/10/2019 1115   CL 102 04/10/2019 1115   CO2 22 04/10/2019 1115   BUN 7 04/10/2019 1115   CREATININE 0.58 04/10/2019 1115      Component Value Date/Time   CALCIUM 9.5 04/10/2019 1115   ALKPHOS 77 04/10/2019 1115   AST 16 04/10/2019 1115   ALT 13 04/10/2019 1115   BILITOT 0.3 04/10/2019 1115       RADIOGRAPHIC STUDIES: No results found.  ASSESSMENT: This is a very pleasant 23 years old white female diagnosed with stage Ia (T1b, N0, M0) low-grade neuroendocrine carcinoma, carcinoid tumor presented with endobronchial lesion and the bronchus intermedius status post right middle and lower bilobectomy with lymph node dissection on October 17, 2018 under the care of Dr. Roxan Hockey.   PLAN: I had a lengthy discussion with the patient today about her current disease status, prognosis and treatment options.   I personally and independently reviewed all her previous imaging studies and pathology report. I explained to the patient that there is no survival benefit for adjuvant systemic therapy for patient with a stage Ia non-small cell lung cancer especially carcinoid tumor. I recommended for the patient to continue on observation with repeat CT scan of the chest in the next few days for restaging of her disease. If the scan showed no concerning findings for disease recurrence, I will see her back for follow-up visit in 6 months with repeat CT scan of the chest. The patient was advised to call  immediately if she has any other concerning symptoms in the interval. The patient voices understanding of current disease status and treatment options and is in agreement with the current care plan.  All questions were answered. The patient knows to call the clinic with any problems, questions or concerns. We can certainly see the patient much sooner if necessary.  Thank you so much for allowing me to participate in the care of Susan Elliott. I will continue to follow up the patient with you and assist in her care.  I spent 40 minutes counseling the patient face to face. The total time spent in the appointment was 60 minutes.  Disclaimer: This note was dictated with voice recognition software. Similar sounding words can inadvertently be  transcribed and may not be corrected upon review.   Eilleen Kempf April 29, 2019, 2:35 PM

## 2019-05-01 ENCOUNTER — Telehealth: Payer: Self-pay | Admitting: Internal Medicine

## 2019-05-01 NOTE — Telephone Encounter (Signed)
Confirmed with patient February/March 2021 appointments. Ct scan for this week already on schedule. Central radiology will contact patient re 6 month scan for February.

## 2019-05-03 ENCOUNTER — Other Ambulatory Visit: Payer: Self-pay

## 2019-05-03 ENCOUNTER — Ambulatory Visit (HOSPITAL_COMMUNITY)
Admission: RE | Admit: 2019-05-03 | Discharge: 2019-05-03 | Disposition: A | Discharge status: Home / Self Care | Payer: 59 | Source: Ambulatory Visit | Attending: Internal Medicine | Admitting: Internal Medicine

## 2019-05-03 DIAGNOSIS — C3490 Malignant neoplasm of unspecified part of unspecified bronchus or lung: Secondary | ICD-10-CM | POA: Diagnosis present

## 2019-05-03 MED ORDER — SODIUM CHLORIDE (PF) 0.9 % IJ SOLN
INTRAMUSCULAR | Status: AC
  Filled 2019-05-03: qty 50

## 2019-05-03 MED ORDER — IOHEXOL 300 MG/ML  SOLN
75.0000 mL | Freq: Once | INTRAMUSCULAR | Status: AC | PRN
  Administered 2019-05-03: 75 mL via INTRAVENOUS

## 2019-05-16 ENCOUNTER — Telehealth: Payer: Self-pay | Admitting: Primary Care

## 2019-05-16 NOTE — Telephone Encounter (Signed)
Spoke with the pt's mother Susan Elliott  She states needing letter for short term disability stating that she did not have a pre-existing condition prior to when she was seen by [place] in 2019 Her insurance is trying to get out of paying her 12 wks that she was out of work after having her VATS procedure with Dr Roxan Hockey 10/22/18 Please advise thanks

## 2019-05-20 ENCOUNTER — Encounter: Payer: Self-pay | Admitting: *Deleted

## 2019-05-20 NOTE — Telephone Encounter (Signed)
Called and spoke to patient's mother who is also listed on DPR. Patient saw the letter on MyChart and needs the wording to be different.  The letter show "no pre-existing medications"  They need the letter to say no pre-existing medical conditions in 2019 related to lung surgery in 2020.  Routing to Lauren to follow up on and see if Dr. Loanne Drilling is willing to sign the letter this way.

## 2019-05-20 NOTE — Telephone Encounter (Signed)
ATC patient' mom left, message to pick up letter, which was placed up front in file folder.  Nothing further needed at this time.

## 2019-05-20 NOTE — Telephone Encounter (Signed)
Letter has been created will place in front for patient to pick up.   ATC patient letting her know the letter was done and ready for pick up

## 2019-05-20 NOTE — Telephone Encounter (Signed)
Chart reviewed. I agree with the statement that [name] had no pre-existing medications when she was seen by our clinic in 2019. Please draft a letter for our insurance for me to sign.  Rodman Pickle, MD

## 2019-05-20 NOTE — Telephone Encounter (Signed)
Needs wording to be different in letter.  Misty, mother, can be reached [phone]

## 2019-05-22 ENCOUNTER — Encounter: Payer: Self-pay | Admitting: Thoracic Surgery (Cardiothoracic Vascular Surgery)

## 2019-05-22 NOTE — Progress Notes (Signed)
      [address]       [place] [address]             [phone]      May 22, 2019  To whom it may concern,  Susan Elliott ([birth year]) was treated for a carcinoid tumor that was first discovered on a CT of the chest done on January 21,2020. She underwent bronchoscopy on September 21, 2018 which visually confirmed the endobronchial mass and then surgical resection on October 17, 2018. It was not a pre-existing medical condition prior to that time period.  Sincerely    Revonda Standard. Roxan Hockey, MD [facility]

## 2019-05-22 NOTE — Progress Notes (Signed)
      [address]       ,[place] [address]             [phone]    May 22, 2019   To whom it may concern.    Susan Elliott (Dec 07, 1995) was treated for a carcinoid tumor that was first discovered on a CT of the chest in January 2020. She underwent bronchoscopy for diagnostic purposes on 09/21/2018. She then had surgical resection 10/18/2018. This was not a pre-existing medical condition prior to being seen in Dr. Cordelia Pen office in December 2019.  Sincerely,     Revonda Standard. Roxan Hockey, MD [facility] [address], [place], [place] [address] [phone]

## 2019-06-11 ENCOUNTER — Ambulatory Visit: Payer: 59 | Admitting: Thoracic Surgery (Cardiothoracic Vascular Surgery)

## 2019-06-11 ENCOUNTER — Ambulatory Visit: Payer: 59 | Admitting: Allergy & Immunology

## 2019-06-18 ENCOUNTER — Ambulatory Visit: Payer: 59 | Admitting: Thoracic Surgery (Cardiothoracic Vascular Surgery)

## 2019-07-12 ENCOUNTER — Other Ambulatory Visit: Payer: Self-pay | Admitting: Thoracic Surgery (Cardiothoracic Vascular Surgery)

## 2019-07-12 DIAGNOSIS — C7A090 Malignant carcinoid tumor of the bronchus and lung: Secondary | ICD-10-CM

## 2019-07-16 ENCOUNTER — Ambulatory Visit (INDEPENDENT_AMBULATORY_CARE_PROVIDER_SITE_OTHER): Payer: 59 | Admitting: Thoracic Surgery (Cardiothoracic Vascular Surgery)

## 2019-07-16 ENCOUNTER — Other Ambulatory Visit: Payer: Self-pay

## 2019-07-16 ENCOUNTER — Ambulatory Visit
Admission: RE | Admit: 2019-07-16 | Discharge: 2019-07-16 | Disposition: A | Discharge status: Home / Self Care | Payer: 59 | Source: Ambulatory Visit | Attending: Thoracic Surgery (Cardiothoracic Vascular Surgery) | Admitting: Thoracic Surgery (Cardiothoracic Vascular Surgery)

## 2019-07-16 ENCOUNTER — Encounter: Payer: Self-pay | Admitting: Thoracic Surgery (Cardiothoracic Vascular Surgery)

## 2019-07-16 VITALS — BP 123/83 | HR 95 | Temp 97.7°F | Resp 16 | Ht 67.5 in | Wt 240.0 lb

## 2019-07-16 DIAGNOSIS — D3A090 Benign carcinoid tumor of the bronchus and lung: Secondary | ICD-10-CM

## 2019-07-16 DIAGNOSIS — Z902 Acquired absence of lung [part of]: Secondary | ICD-10-CM | POA: Diagnosis not present

## 2019-07-16 DIAGNOSIS — C7A090 Malignant carcinoid tumor of the bronchus and lung: Secondary | ICD-10-CM

## 2019-07-16 NOTE — Progress Notes (Signed)
[address]       [place] [address]             [phone]       HPI: Ms. Bow returns for scheduled follow-up visit  Daisee Centner is a 23 year old young woman who presented with recurrent pneumonia secondary to an obstructing carcinoid tumor in the bronchus intermedius.  She had a right lower and middle bilobectomy on 10/17/2018.  Her initial postoperative course was complicated by mucous plugging.  She did had an episode of hemoptysis but that was isolated.    I last saw her in July.  She was doing well at that time with only occasional wheezing and and occasional sharp pain from her incision.  Since I last saw her she has continued to do well.  She has not had any wheezing recently.  She does not have any pain related to her incisions.  She saw Dr. Julien Nordmann in August.  She sees him again in 6 months.  Past Medical History:  Diagnosis Date   Asthma    Celiac disease 7408   Complication of anesthesia    migraine x3 days   Dyspnea     1/ 23/2020- just since Ive had pneumonia   Migraine    Pneumonia 2019 aug   Pneumonia due to Streptococcus pneumoniae ([place]) 08/09/2018    Current Outpatient Medications  Medication Sig Dispense Refill   acetaminophen (TYLENOL) 325 MG tablet Take 650 mg by mouth every 6 (six) hours as needed (for pain.).     albuterol (PROVENTIL HFA;VENTOLIN HFA) 108 (90 Base) MCG/ACT inhaler Inhale 2 puffs into the lungs every 4 (four) hours as needed for wheezing or shortness of breath. 1 Inhaler 2   Biotin 5000 MCG TABS Take 5,000 mcg by mouth daily.     cetirizine (ZYRTEC) 10 MG tablet Take 10 mg by mouth daily.     Multiple Vitamin (MULTIVITAMIN WITH MINERALS) TABS tablet Take 1 tablet by mouth daily.     Norethin Ace-Eth Estrad-FE 1-20 MG-MCG(24) CHEW CHEW 1 TABLET DAILY (Patient taking differently: Chew 1 tablet by mouth every evening. (1900)) 84 tablet 4   No current facility-administered medications for this visit.      Physical Exam BP 123/83 (BP Location: Left Arm, Patient Position: Sitting, Cuff Size: Large)    Pulse 95    Temp 97.7 F (36.5 C)    Resp 16    Ht 5' 7.5" (1.715 m)    Wt 240 lb (108.9 kg)    LMP 07/09/2019    SpO2 98% Comment: RA   BMI 37.53 kg/m  23 year old woman in no acute distress Alert and oriented x3 with no focal deficits Lungs decreased breath sounds right base, otherwise clear, no wheezing  Diagnostic Tests: CHEST - 2 VIEW  COMPARISON:  03/12/2019 chest radiograph.  FINDINGS: Stable cardiomediastinal silhouette with normal heart size. No pneumothorax. Stable mild blunting of the right costophrenic angle. No left pleural effusion. Stable volume loss in the right hemithorax. No pulmonary edema. No acute consolidative airspace disease. Minimal right lung base scarring.  IMPRESSION: Stable chest radiograph with volume loss in the right hemithorax and blunting of the right costophrenic angle, favored to represent a combination of mild postsurgical scarring and trace right pleural effusion.   Electronically Signed   By: Ilona Sorrel M.D.   On: 07/16/2019 09:46 I personally reviewed the chest x-ray images.  It is improved from her x-ray in July.  Findings consistent with  previous bilobectomy.  Impression: Lynden Carrithers is a 23 year old woman who had a right lower middle bilobectomy for a stage Ia carcinoid tumor in February 2020.  She had multiple markedly enlarged lymph nodes, but those all turned out to be reactive rather than malignant.  She did not require any adjuvant therapy.  She is doing well at this point time with no issues related to her surgery.  She is being followed by Dr. Julien Nordmann.  Plan: Follow-up with Dr. Julien Nordmann  I will be happy to see Ms. Eakle back anytime in the future if I can be of any further assistance with her care  Melrose Nakayama, MD [facility] [phone]

## 2019-08-06 ENCOUNTER — Encounter: Payer: 59 | Admitting: Obstetrics & Gynecology

## 2019-10-01 ENCOUNTER — Telehealth: Payer: Self-pay | Admitting: Internal Medicine

## 2019-10-01 NOTE — Telephone Encounter (Signed)
Rescheduled per 2/1 sch msg, pt req. Called and spoke with pt, confirmed 2/26 and 3/1 appt

## 2019-10-18 ENCOUNTER — Other Ambulatory Visit: Payer: Self-pay | Admitting: Obstetrics & Gynecology

## 2019-10-18 NOTE — Telephone Encounter (Signed)
Very overdue for CE but it is schedule for 10/28/19.

## 2019-10-25 ENCOUNTER — Inpatient Hospital Stay: Payer: 59

## 2019-10-25 ENCOUNTER — Ambulatory Visit (HOSPITAL_COMMUNITY)
Admission: RE | Admit: 2019-10-25 | Discharge: 2019-10-25 | Disposition: A | Discharge status: Home / Self Care | Payer: 59 | Source: Ambulatory Visit | Attending: Internal Medicine | Admitting: Internal Medicine

## 2019-10-25 ENCOUNTER — Encounter (HOSPITAL_COMMUNITY): Payer: Self-pay | Admitting: Radiology

## 2019-10-25 ENCOUNTER — Other Ambulatory Visit: Payer: Self-pay

## 2019-10-25 ENCOUNTER — Other Ambulatory Visit: Payer: 59

## 2019-10-25 DIAGNOSIS — C7A090 Malignant carcinoid tumor of the bronchus and lung: Secondary | ICD-10-CM | POA: Insufficient documentation

## 2019-10-25 DIAGNOSIS — C3490 Malignant neoplasm of unspecified part of unspecified bronchus or lung: Secondary | ICD-10-CM | POA: Diagnosis not present

## 2019-10-25 LAB — CMP (CANCER CENTER ONLY)
ALT: 11 U/L (ref 0–44)
AST: 11 U/L — ABNORMAL LOW (ref 15–41)
Albumin: 3.4 g/dL — ABNORMAL LOW (ref 3.5–5.0)
Alkaline Phosphatase: 76 U/L (ref 38–126)
Anion gap: 12 (ref 5–15)
BUN: 8 mg/dL (ref 6–20)
CO2: 25 mmol/L (ref 22–32)
Calcium: 8.8 mg/dL — ABNORMAL LOW (ref 8.9–10.3)
Chloride: 104 mmol/L (ref 98–111)
Creatinine: 0.76 mg/dL (ref 0.44–1.00)
GFR, Est AFR Am: >60 mL/min (ref >60)
GFR, Estimated: >60 mL/min (ref >60)
Glucose, Bld: 85 mg/dL (ref 70–99)
Potassium: 3.7 mmol/L (ref 3.5–5.1)
Sodium: 141 mmol/L (ref 135–145)
Total Bilirubin: 0.3 mg/dL (ref 0.3–1.2)
Total Protein: 6.9 g/dL (ref 6.5–8.1)

## 2019-10-25 LAB — CBC WITH DIFFERENTIAL (CANCER CENTER ONLY)
Abs Immature Granulocytes: 0.02 10*3/uL (ref 0.00–0.07)
Basophils Absolute: 0 10*3/uL (ref 0.0–0.1)
Basophils Relative: 0 %
Eosinophils Absolute: 0.2 10*3/uL (ref 0.0–0.5)
Eosinophils Relative: 2 %
HCT: 40.6 % (ref 36.0–46.0)
Hemoglobin: 13.3 g/dL (ref 12.0–15.0)
Immature Granulocytes: 0 %
Lymphocytes Relative: 26 %
Lymphs Abs: 2.4 10*3/uL (ref 0.7–4.0)
MCH: 28.5 pg (ref 26.0–34.0)
MCHC: 32.8 g/dL (ref 30.0–36.0)
MCV: 87.1 fL (ref 80.0–100.0)
Monocytes Absolute: 0.5 10*3/uL (ref 0.1–1.0)
Monocytes Relative: 5 %
Neutro Abs: 6.1 10*3/uL (ref 1.7–7.7)
Neutrophils Relative %: 67 %
Platelet Count: 268 10*3/uL (ref 150–400)
RBC: 4.66 MIL/uL (ref 3.87–5.11)
RDW: 13.2 % (ref 11.5–15.5)
WBC Count: 9.2 10*3/uL (ref 4.0–10.5)
nRBC: 0 % (ref 0.0–0.2)

## 2019-10-25 MED ORDER — SODIUM CHLORIDE (PF) 0.9 % IJ SOLN
INTRAMUSCULAR | Status: AC
  Filled 2019-10-25: qty 50

## 2019-10-25 MED ORDER — IOHEXOL 300 MG/ML  SOLN
75.0000 mL | Freq: Once | INTRAMUSCULAR | Status: AC | PRN
  Administered 2019-10-25: 75 mL via INTRAVENOUS

## 2019-10-27 NOTE — Progress Notes (Signed)
[facility] OFFICE PROGRESS NOTE  [name], PA-C [place] [address]  DIAGNOSIS: Stage Ia (T1b, N0, M0) low-grade neuroendocrine carcinoma, carcinoid tumor presented with endobronchial lesion and the bronchus intermedius. Diagnosed January 2020.  PRIOR THERAPY: 1) Status post right middle and lower bilobectomy with lymph node dissection on October 17, 2018 under the care of Dr. Roxan Hockey  CURRENT THERAPY: Observation.   INTERVAL HISTORY: Susan Elliott 24 y.o. female returns to the clinic for a follow up visit. She was diagnosed with a low grade carcinoid tumor in 2020 s/p right middle and lower bilobectomy with lymph node dissection. Today, she is feeling well except for some pleuritic chest pain since her surgery, which is improved from prior. She also mentions that she developed a rash of unclear etiology for which she is seeing an allergist. This first appearing in the summer of 2020. The rash is not localized to one particular region. She has been treated with steroids which improves her symptoms temporarily. Her rash often recurs after completion of her steroids.   Today, she denies any recent fevers, chills, night sweats, or weight loss. Denies any flushing. Denies shortness of breath, cough, or hemoptysis. Denies wheezing. She denies nausea, vomiting, diarrhea, or constipation. Denies headache or visual changes. She recently had a restaging CT scan performed. She is here for evaluation and to review her scan results.   MEDICAL HISTORY: Past Medical History:  Diagnosis Date  . Asthma   . Celiac disease 2011  . Complication of anesthesia    migraine x3 days  . Dyspnea     1/ 23/2020- just since Ive had pneumonia  . Migraine   . Pneumonia 2019 aug  . Pneumonia due to Streptococcus pneumoniae ([facility]) 08/09/2018    ALLERGIES:  is allergic to penicillins; gluten meal; and ultram [tramadol hcl].  MEDICATIONS:  Current Outpatient  Medications  Medication Sig Dispense Refill  . acetaminophen (TYLENOL) 325 MG tablet Take 650 mg by mouth every 6 (six) hours as needed (for pain.).    Marland Kitchen Biotin 5000 MCG TABS Take 5,000 mcg by mouth daily.    . cetirizine (ZYRTEC) 10 MG tablet Take 10 mg by mouth daily.    . Multiple Vitamin (MULTIVITAMIN WITH MINERALS) TABS tablet Take 1 tablet by mouth daily.    . Norethin Ace-Eth Estrad-FE 1-20 MG-MCG(24) CHEW Take one tab po daily. (Patient not taking: Reported on 10/28/2019) 84 tablet 0   No current facility-administered medications for this visit.    SURGICAL HISTORY:  Past Surgical History:  Procedure Laterality Date  . CHOLECYSTECTOMY    . IR THORACENTESIS ASP PLEURAL SPACE W/IMG GUIDE  10/31/2018  . LOBECTOMY N/A 10/17/2018   Procedure: Right middle and lower  BILOBECTOMY;  Surgeon: Melrose Nakayama, MD;  Location: [facility];  Service: Thoracic;  Laterality: N/A;  . VIDEO ASSISTED THORACOSCOPY Right 10/17/2018   Procedure: VIDEO ASSISTED THORACOSCOPY with medial lymph node dissection and intercostal nerve block;  Surgeon: Melrose Nakayama, MD;  Location: [facility];  Service: Thoracic;  Laterality: Right;  Marland Kitchen VIDEO BRONCHOSCOPY N/A 09/21/2018   Procedure: VIDEO BRONCHOSCOPY WITH FORCEPS BIOPSY, right middle lung lobe;  Surgeon: Margaretha Seeds, MD;  Location: [facility];  Service: Thoracic;  Laterality: N/A;  . VIDEO BRONCHOSCOPY N/A 10/17/2018   Procedure: VIDEO BRONCHOSCOPY;  Surgeon: Melrose Nakayama, MD;  Location: [facility];  Service: Thoracic;  Laterality: N/A;  . VIDEO BRONCHOSCOPY N/A 10/22/2018   Procedure: VIDEO BRONCHOSCOPY;  Surgeon:  Melrose Nakayama, MD;  Location: [facility] OR;  Service: Thoracic;  Laterality: N/A;    REVIEW OF SYSTEMS:   Review of Systems  Constitutional: Negative for appetite change, chills, fatigue, fever and unexpected weight change.  HENT: Negative for mouth sores, nosebleeds, sore throat and trouble swallowing.   Eyes: Negative for eye problems and  icterus.  Respiratory: Negative for cough, hemoptysis, shortness of breath and wheezing.   Cardiovascular: Positive for chest pain with deep breathing. Negative for leg swelling.  Gastrointestinal: Negative for abdominal pain, constipation, diarrhea, nausea and vomiting.  Genitourinary: Negative for bladder incontinence, difficulty urinating, dysuria, frequency and hematuria.   Musculoskeletal: Negative for back pain, gait problem, neck pain and neck stiffness.  Skin: Positive for intermittent rashes. Neurological: Negative for dizziness, extremity weakness, gait problem, headaches, light-headedness and seizures.  Hematological: Negative for adenopathy. Does not bruise/bleed easily.  Psychiatric/Behavioral: Negative for confusion, depression and sleep disturbance. The patient is not nervous/anxious.     PHYSICAL EXAMINATION:  Blood pressure (!) 158/74, pulse (!) 124, temperature 97.8 F (36.6 C), temperature source Temporal, resp. rate 18, height 5' 7"  (1.702 m), weight 249 lb (112.9 kg), last menstrual period 10/11/2019, SpO2 100 %.  ECOG PERFORMANCE STATUS: 0 - Asymptomatic  Physical Exam  Constitutional: Oriented to person, place, and time and well-developed, well-nourished, and in no distress.  HENT:  Head: Normocephalic and atraumatic.  Mouth/Throat: Oropharynx is clear and moist. No oropharyngeal exudate.  Eyes: Conjunctivae are normal. Right eye exhibits no discharge. Left eye exhibits no discharge. No scleral icterus.  Neck: Normal range of motion. Neck supple.  Cardiovascular: Normal rate, regular rhythm, normal heart sounds and intact distal pulses.   Pulmonary/Chest: Effort normal and breath sounds normal. No respiratory distress. No wheezes. No rales.  Abdominal: Soft. Bowel sounds are normal. Exhibits no distension and no mass. There is no tenderness.  Musculoskeletal: Normal range of motion. Exhibits no edema.  Lymphadenopathy:    No cervical adenopathy.  Neurological:  Alert and oriented to person, place, and time. Exhibits normal muscle tone. Gait normal. Coordination normal.  Skin: Skin is warm and dry. No rash noted. Not diaphoretic. No erythema. No pallor.  Psychiatric: Mood, memory and judgment normal.  Vitals reviewed.  LABORATORY DATA: Lab Results  Component Value Date   WBC 9.2 10/25/2019   HGB 13.3 10/25/2019   HCT 40.6 10/25/2019   MCV 87.1 10/25/2019   PLT 268 10/25/2019      Chemistry      Component Value Date/Time   NA 141 10/25/2019 0944   NA 138 04/10/2019 1115   K 3.7 10/25/2019 0944   CL 104 10/25/2019 0944   CO2 25 10/25/2019 0944   BUN 8 10/25/2019 0944   BUN 7 04/10/2019 1115   CREATININE 0.76 10/25/2019 0944      Component Value Date/Time   CALCIUM 8.8 (L) 10/25/2019 0944   ALKPHOS 76 10/25/2019 0944   AST 11 (L) 10/25/2019 0944   ALT 11 10/25/2019 0944   BILITOT 0.3 10/25/2019 0944       RADIOGRAPHIC STUDIES:  CT Chest W Contrast  Result Date: 10/25/2019 CLINICAL DATA:  Non-small-cell lung cancer, low-grade neuroendocrine tumor. Staging. Status post right lower and middle lobectomy. EXAM: CT CHEST WITH CONTRAST TECHNIQUE: Multidetector CT imaging of the chest was performed during intravenous contrast administration. CONTRAST:  28m OMNIPAQUE IOHEXOL 300 MG/ML  SOLN COMPARISON:  05/03/2019 FINDINGS: Cardiovascular: The heart size is normal. No substantial pericardial effusion. No thoracic aortic aneurysm. Mediastinum/Nodes: No mediastinal lymphadenopathy.  There is no hilar lymphadenopathy. The esophagus has normal imaging features. There is no axillary lymphadenopathy. Lungs/Pleura: Volume loss right hemithorax is stable and compatible with the history of right middle and lower lobe resection. No suspicious pulmonary nodule or mass. Similar appearance of probable subpleural atelectasis posterior left lower no focal airspace consolidation. No pleural effusion. Upper Abdomen: Unremarkable. Musculoskeletal: No worrisome  lytic or sclerotic osseous abnormality. IMPRESSION: Stable exam. No new or progressive findings to suggest recurrent or metastatic disease. Surgical changes in the right hemithorax consistent with right middle and lower lobectomy Electronically Signed   By: Misty Stanley M.D.   On: 10/25/2019 10:01     ASSESSMENT/PLAN:  This is a very pleasant 24 year old Caucasian female diagnosed with stage Ia (T1b, N0, M0) low-grade neuroendocrine carcinoma, carcinoid tumor presented with endobronchial lesion and the bronchus intermedius status post right middle and lower bilobectomy with lymph node dissection on October 17, 2018 under the care of Dr. Roxan Hockey.  She is on observation and is feeling well.   She recently had a restaging CT scan performed. Dr. Julien Nordmann personally and independently reviewed the scan and discussed the results with the patient. The scan did not show any evidence of disease recurrence. Dr. Julien Nordmann recommends that the patient continue on observation.   I will arrange for a restaging CT scan of the chest in 1 year.   For her rash, she will continue to follow with her allergist.   The patient was advised to call immediately if she has any concerning symptoms in the interval. The patient voices understanding of current disease status and treatment options and is in agreement with the current care plan. All questions were answered. The patient knows to call the clinic with any problems, questions or concerns. We can certainly see the patient much sooner if necessary   Orders Placed This Encounter  Procedures  . CT Chest W Contrast    Standing Status:   Future    Standing Expiration Date:   10/27/2020    Order Specific Question:   ** REASON FOR EXAM (FREE TEXT)    Answer:   Restaging Carcinoid tumor    Order Specific Question:   If indicated for the ordered procedure, I authorize the administration of contrast media per Radiology protocol    Answer:   Yes    Order Specific  Question:   Is patient pregnant?    Answer:   No    Order Specific Question:   Preferred imaging location?    Answer:   [facility]    Order Specific Question:   Radiology Contrast Protocol - do NOT remove file path    Answer:   [email or web address]  . CBC with Differential ([facility] Only)    Standing Status:   Future    Standing Expiration Date:   10/27/2020  . CMP ([facility] only)    Standing Status:   Future    Standing Expiration Date:   10/27/2020     Tobe Sos Pricila Bridge, PA-C 10/28/19  ADDENDUM: Hematology/Oncology Attending: I had a face-to-face encounter with the patient today.  I recommended her care plan.  This is a very pleasant 24 years old white female with a stage Ia low-grade neuroendocrine carcinoma, carcinoid tumor status post right middle and lower bilobectomy with lymph node dissection in February 2020 under the care of Dr. Roxan Hockey.  The patient is currently on observation and she is feeling fine.  The patient is here today for evaluation with repeat  CT scan of the chest which showed no concerning findings for disease recurrence or metastasis. I recommended for her to continue on observation with repeat CT scan of the chest in 1 year. She was advised to call immediately if she has any concerning symptoms in the interval.  Disclaimer: This note was dictated with voice recognition software. Similar sounding words can inadvertently be transcribed and may be missed upon review. Eilleen Kempf, MD 10/28/19

## 2019-10-28 ENCOUNTER — Encounter: Payer: Self-pay | Admitting: Physician Assistant

## 2019-10-28 ENCOUNTER — Other Ambulatory Visit: Payer: Self-pay

## 2019-10-28 ENCOUNTER — Ambulatory Visit: Payer: 59 | Admitting: Internal Medicine

## 2019-10-28 ENCOUNTER — Ambulatory Visit (INDEPENDENT_AMBULATORY_CARE_PROVIDER_SITE_OTHER): Payer: 59 | Admitting: Obstetrics & Gynecology

## 2019-10-28 ENCOUNTER — Encounter: Payer: Self-pay | Admitting: Obstetrics & Gynecology

## 2019-10-28 ENCOUNTER — Inpatient Hospital Stay: Payer: 59 | Attending: Internal Medicine | Admitting: Physician Assistant

## 2019-10-28 VITALS — BP 158/74 | HR 124 | Temp 97.8°F | Resp 18 | Ht 67.0 in | Wt 249.0 lb

## 2019-10-28 VITALS — BP 126/84 | Ht 67.0 in | Wt 247.0 lb

## 2019-10-28 DIAGNOSIS — D3A090 Benign carcinoid tumor of the bronchus and lung: Secondary | ICD-10-CM | POA: Diagnosis not present

## 2019-10-28 DIAGNOSIS — R0789 Other chest pain: Secondary | ICD-10-CM | POA: Insufficient documentation

## 2019-10-28 DIAGNOSIS — Z01419 Encounter for gynecological examination (general) (routine) without abnormal findings: Secondary | ICD-10-CM

## 2019-10-28 DIAGNOSIS — C7A090 Malignant carcinoid tumor of the bronchus and lung: Secondary | ICD-10-CM | POA: Diagnosis not present

## 2019-10-28 DIAGNOSIS — E6609 Other obesity due to excess calories: Secondary | ICD-10-CM

## 2019-10-28 DIAGNOSIS — Z85110 Personal history of malignant carcinoid tumor of bronchus and lung: Secondary | ICD-10-CM | POA: Diagnosis not present

## 2019-10-28 DIAGNOSIS — R21 Rash and other nonspecific skin eruption: Secondary | ICD-10-CM | POA: Insufficient documentation

## 2019-10-28 DIAGNOSIS — Z3009 Encounter for other general counseling and advice on contraception: Secondary | ICD-10-CM | POA: Diagnosis not present

## 2019-10-28 DIAGNOSIS — Z6838 Body mass index (BMI) 38.0-38.9, adult: Secondary | ICD-10-CM

## 2019-10-28 NOTE — Progress Notes (Signed)
Susan Elliott Dec 22, 1995 [id number]   History:    24 y.o. G0 Single.  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  RP:  Established patient presenting for annual gyn exam   HPI: Well on birth control pill with the generic of Loestrin FE 1/20.  Not having withdrawal bleeding. No pelvic pain.  Abstinent.  Recent Rt lung surgery for a Carcinoid tumor (2 lobes removed).  Followed by Oncology.   Urine and bowel movements normal.  Breasts normal.  Body mass index 38.69.  Not regularly physically active.   Past medical history,surgical history, family history and social history were all reviewed and documented in the EPIC chart.  Gynecologic History Patient's last menstrual period was 10/11/2019.  Obstetric History OB History  Gravida Para Term Preterm AB Living  1       1 0  SAB TAB Ectopic Multiple Live Births    1          # Outcome Date GA Lbr Len/2nd Weight Sex Delivery Anes PTL Lv  1 TAB              ROS: A ROS was performed and pertinent positives and negatives are included in the history.  GENERAL: No fevers or chills. HEENT: No change in vision, no earache, sore throat or sinus congestion. NECK: No pain or stiffness. CARDIOVASCULAR: No chest pain or pressure. No palpitations. PULMONARY: No shortness of breath, cough or wheeze. GASTROINTESTINAL: No abdominal pain, nausea, vomiting or diarrhea, melena or bright red blood per rectum. GENITOURINARY: No urinary frequency, urgency, hesitancy or dysuria. MUSCULOSKELETAL: No joint or muscle pain, no back pain, no recent trauma. DERMATOLOGIC: No rash, no itching, no lesions. ENDOCRINE: No polyuria, polydipsia, no heat or cold intolerance. No recent change in weight. HEMATOLOGICAL: No anemia or easy bruising or bleeding. NEUROLOGIC: No headache, seizures, numbness, tingling or weakness. PSYCHIATRIC: No depression, no loss of interest in normal activity or change in sleep pattern.     Exam:   BP 126/84   Ht 5'  7" (1.702 m)   Wt 247 lb (112 kg)   LMP 10/11/2019 Comment: pill  BMI 38.69 kg/m   Body mass index is 38.69 kg/m.  General appearance : Well developed well nourished female. No acute distress HEENT: Eyes: no retinal hemorrhage or exudates,  Neck supple, trachea midline, no carotid bruits, no thyroidmegaly Lungs: Clear to auscultation, no rhonchi or wheezes, or rib retractions  Heart: Regular rate and rhythm, no murmurs or gallops Breast:Examined in sitting and supine position were symmetrical in appearance, no palpable masses or tenderness,  no skin retraction, no nipple inversion, no nipple discharge, no skin discoloration, no axillary or supraclavicular lymphadenopathy Abdomen: no palpable masses or tenderness, no rebound or guarding Extremities: no edema or skin discoloration or tenderness  Pelvic: Vulva: Normal             Vagina: No gross lesions or discharge  Cervix: No gross lesions or discharge.  Pap reflex done.  Uterus  AV, normal size, shape and consistency, non-tender and mobile  Adnexa  Without masses or tenderness  Anus: Normal   Assessment/Plan:  24 y.o. female for annual exam   1. Encounter for routine gynecological examination with Papanicolaou smear of cervix Normal gynecologic exam.  Pap reflex done.  Breast exam normal.  2. Encounter for other general counseling or advice on contraception Patient is currently abstinent.  Given her recent diagnosis of carcinoid bronchial adenoma for which she had 2  lobes of her right lung removed, she prefers not to be on the birth control pill.  Will observe her natural cycles.  Counseling done on contraception in the future.  Would like to proceed with a progesterone IUD insertion if becomes sexually active.  Strict condom use strongly recommended.  3. 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  Followed by oncology.  4. Class 2 obesity due to excess calories  without serious comorbidity with body mass index (BMI) of 38.0 to 38.9 in adult As patient recovers from recent surgery, recommend starting back on fitness with aerobic activities 5 times a week and light weightlifting every 2 days.  Low calorie/carb diet recommended.  Princess Bruins MD, 8:25 AM 10/28/2019

## 2019-10-28 NOTE — Patient Instructions (Signed)
1. Encounter for routine gynecological examination with Papanicolaou smear of cervix Normal gynecologic exam.  Pap reflex done.  Breast exam normal.  2. Encounter for other general counseling or advice on contraception Patient is currently abstinent.  Given her recent diagnosis of carcinoid bronchial adenoma for which she had 2 lobes of her right lung removed, she prefers not to be on the birth control pill.  Will observe her natural cycles.  Counseling done on contraception in the future.  Would like to proceed with a progesterone IUD insertion if becomes sexually active.  Strict condom use strongly recommended.  3. 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  Followed by oncology.  4. Class 2 obesity due to excess calories without serious comorbidity with body mass index (BMI) of 38.0 to 38.9 in adult As patient recovers from recent surgery, recommend starting back on fitness with aerobic activities 5 times a week and light weightlifting every 2 days.  Low calorie/carb diet recommended.  Susan Elliott, it was a pleasure seeing you today!  I will inform you of your results as soon as they are available.

## 2019-10-29 ENCOUNTER — Telehealth: Payer: Self-pay | Admitting: Internal Medicine

## 2019-10-29 LAB — PAP IG W/ RFLX HPV ASCU

## 2019-10-29 NOTE — Telephone Encounter (Signed)
Scheduled per 3/1 los. Called and left msg. Mailed printout

## 2020-01-10 ENCOUNTER — Other Ambulatory Visit: Payer: Self-pay | Admitting: Obstetrics & Gynecology

## 2020-08-19 IMAGING — RF DG SNIFF TEST
7 of 8 series · 14 of 24 positions shown · non-contrast
Comparison: 09/14/2018 and prior chest radiographs

CLINICAL DATA: 22-year-old female with elevated RIGHT hemidiaphragm
on chest radiograph and recurrent pneumonia.

EXAM:
CHEST FLUOROSCOPY
TECHNIQUE: Real-time fluoroscopic evaluation of the chest was performed.
FLUOROSCOPY TIME:  Fluoroscopy Time:  26 seconds
Number of Acquired Spot Images: Multiple cine loops

[Series 1: run · 2 of 44 slices shown (1 of 7)]
[im 1/44]
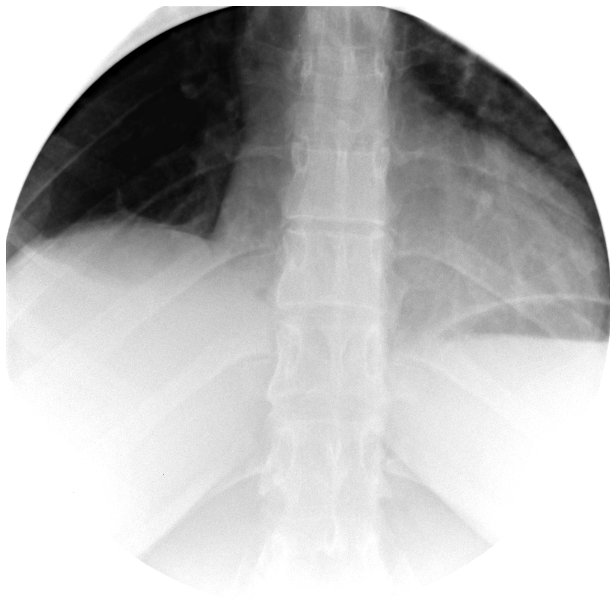
[im 44/44]
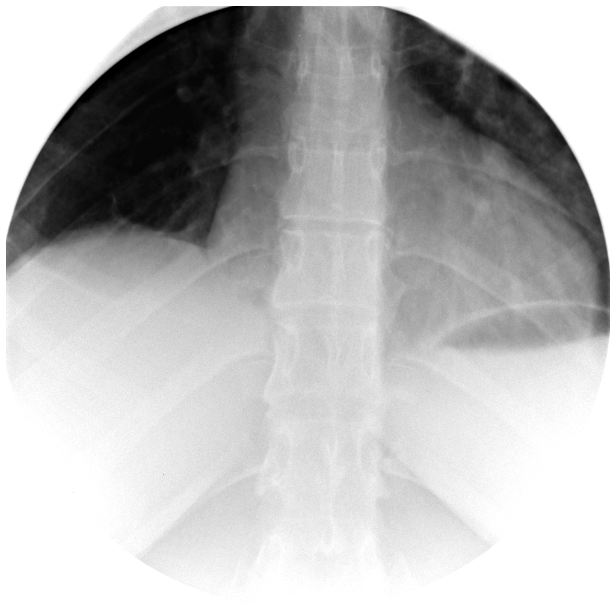

[Series 1: run · 1 of 43 slices shown (2 of 7)]
[im 22/43]
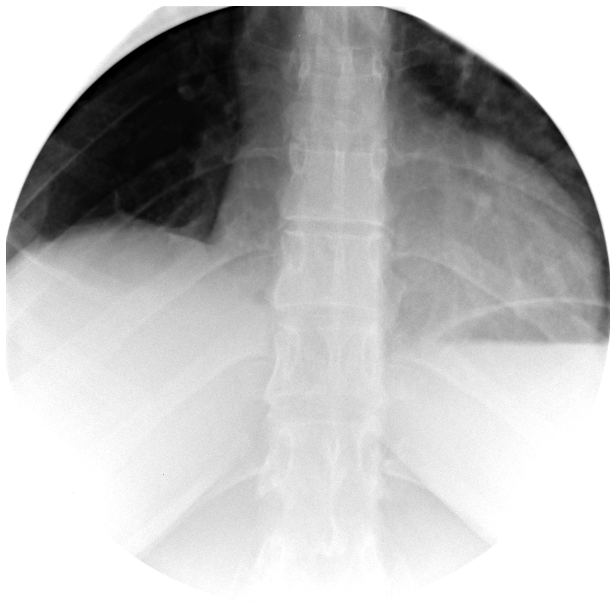

[Series 2: run · 4 of 89 slices shown (3 of 7)]
[im 1/89]
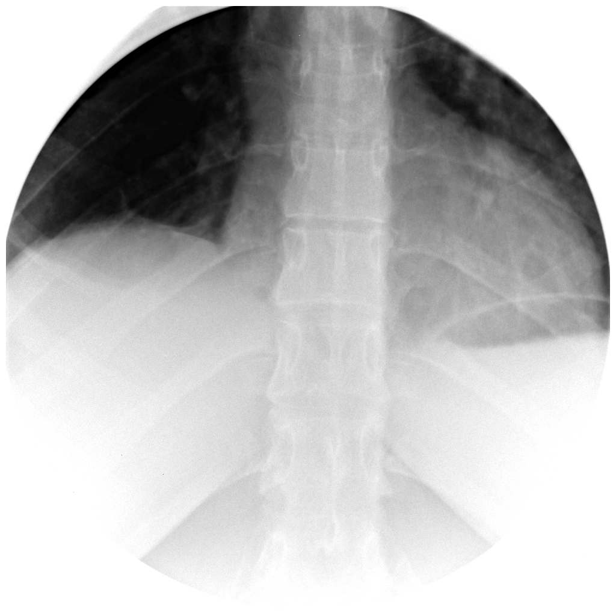
[im 18/89]
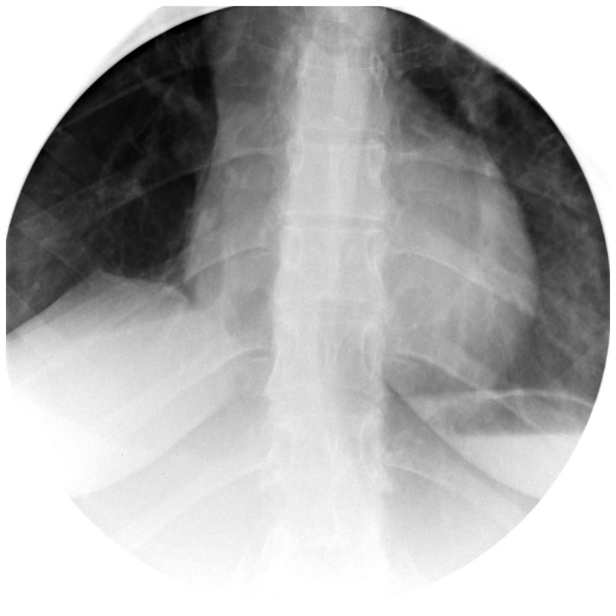
[im 53/89]
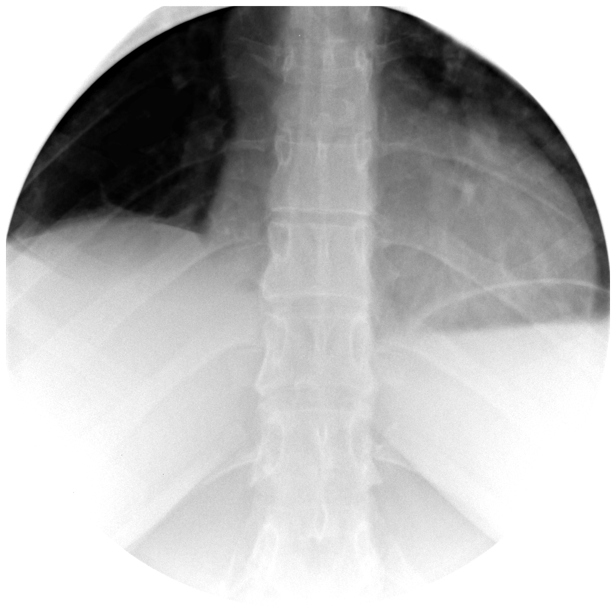
[im 89/89]
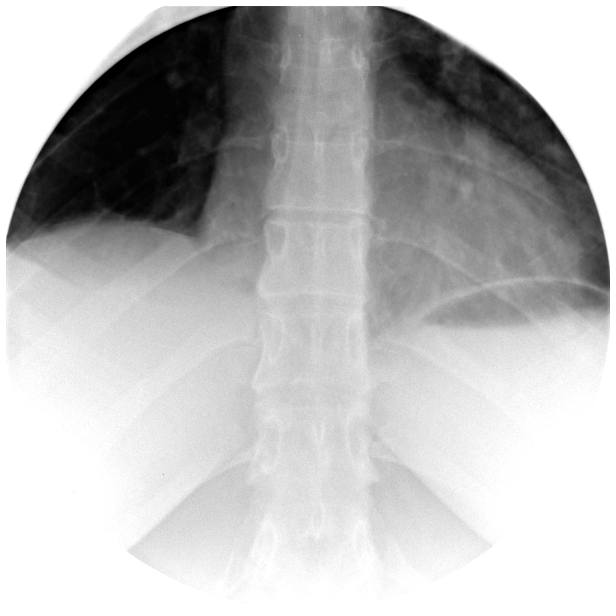

[Series 2: run · 3 of 89 slices shown (4 of 7)]
[im 1/89]
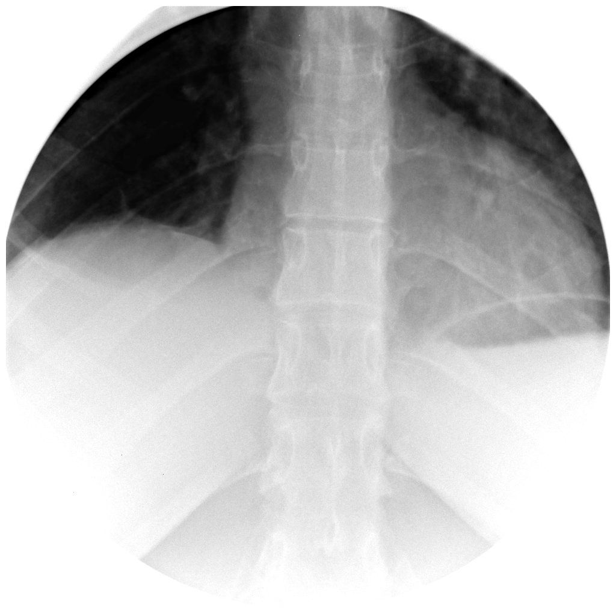
[im 36/89]
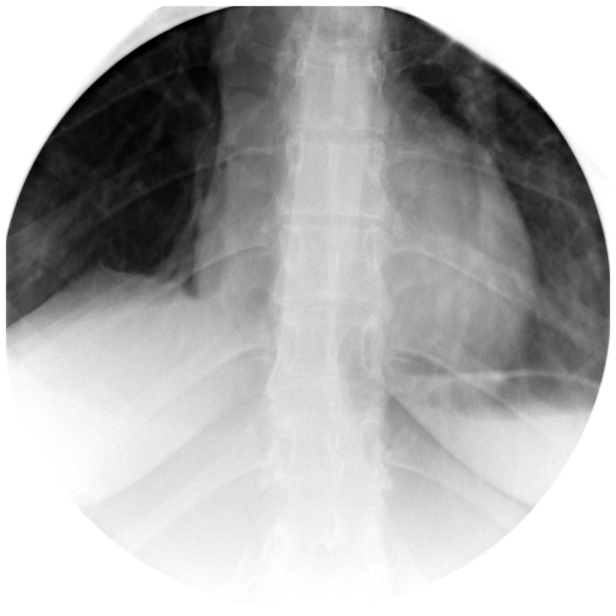
[im 71/89]
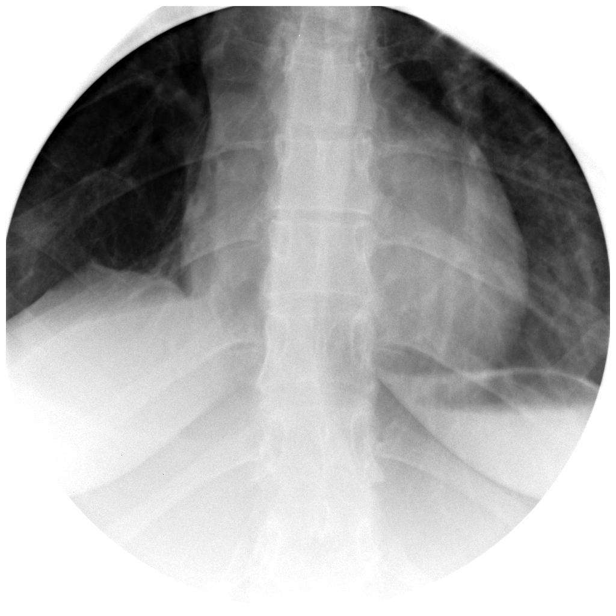

[Series 3: run · 2 of 27 slices shown (5 of 7)]
[im 1/27]
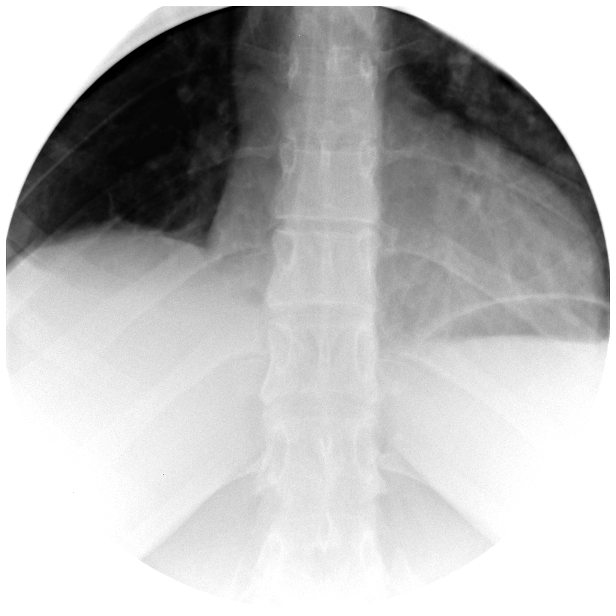
[im 27/27]
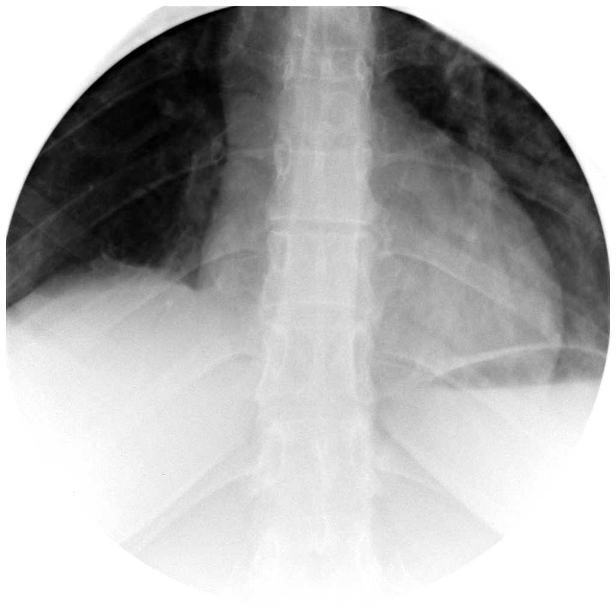

[Series 4: run · 1 of 38 slices shown (6 of 7)]
[im 1/38]
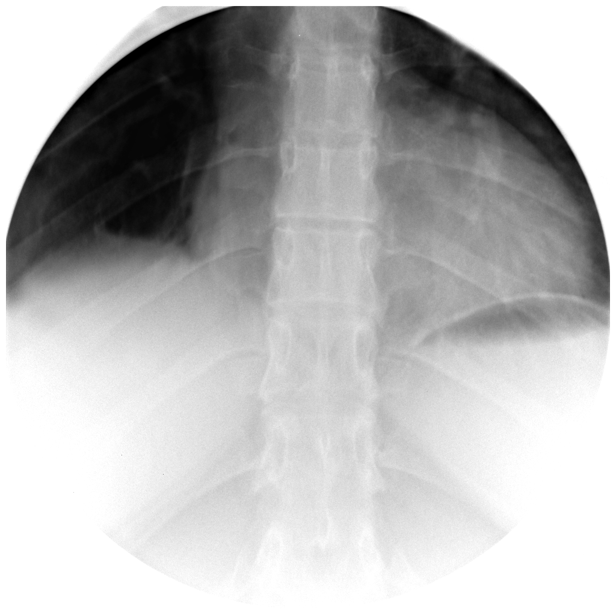

[Series 5: run · 1 of 19 slices shown (7 of 7)]
[im 1/19]
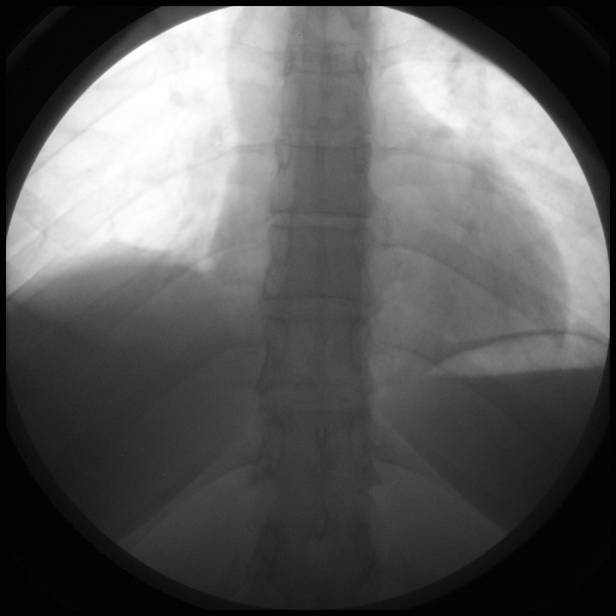

[14 of 24 positions shown; findings below may reference images not displayed]

FINDINGS: Elevated RIGHT hemidiaphragm as compared to the LEFT noted. RIGHT
basilar opacity/probable atelectasis changed is unchanged.

Both hemidiaphragms exhibit normal movement and excursion on both
inspiration and expiration during quiet breathing, deep breathing
and sniffing.
IMPRESSION: 1. Normal movement and excursion of both hemidiaphragms. No evidence
of RIGHT hemidiaphragm paralysis.
2. Unchanged RIGHT hemidiaphragm elevation with RIGHT basilar
opacity/probable atelectasis, stable from 09/14/2018 chest
radiograph.

## 2020-08-22 IMAGING — DX DG CHEST 1V
1 series · 1 of 1 positions shown · non-contrast
Comparison: Chest x-ray September 14, 2018

CLINICAL DATA: Status post right bronchoscopy.

EXAM:
CHEST  1 VIEW

[chest ap]
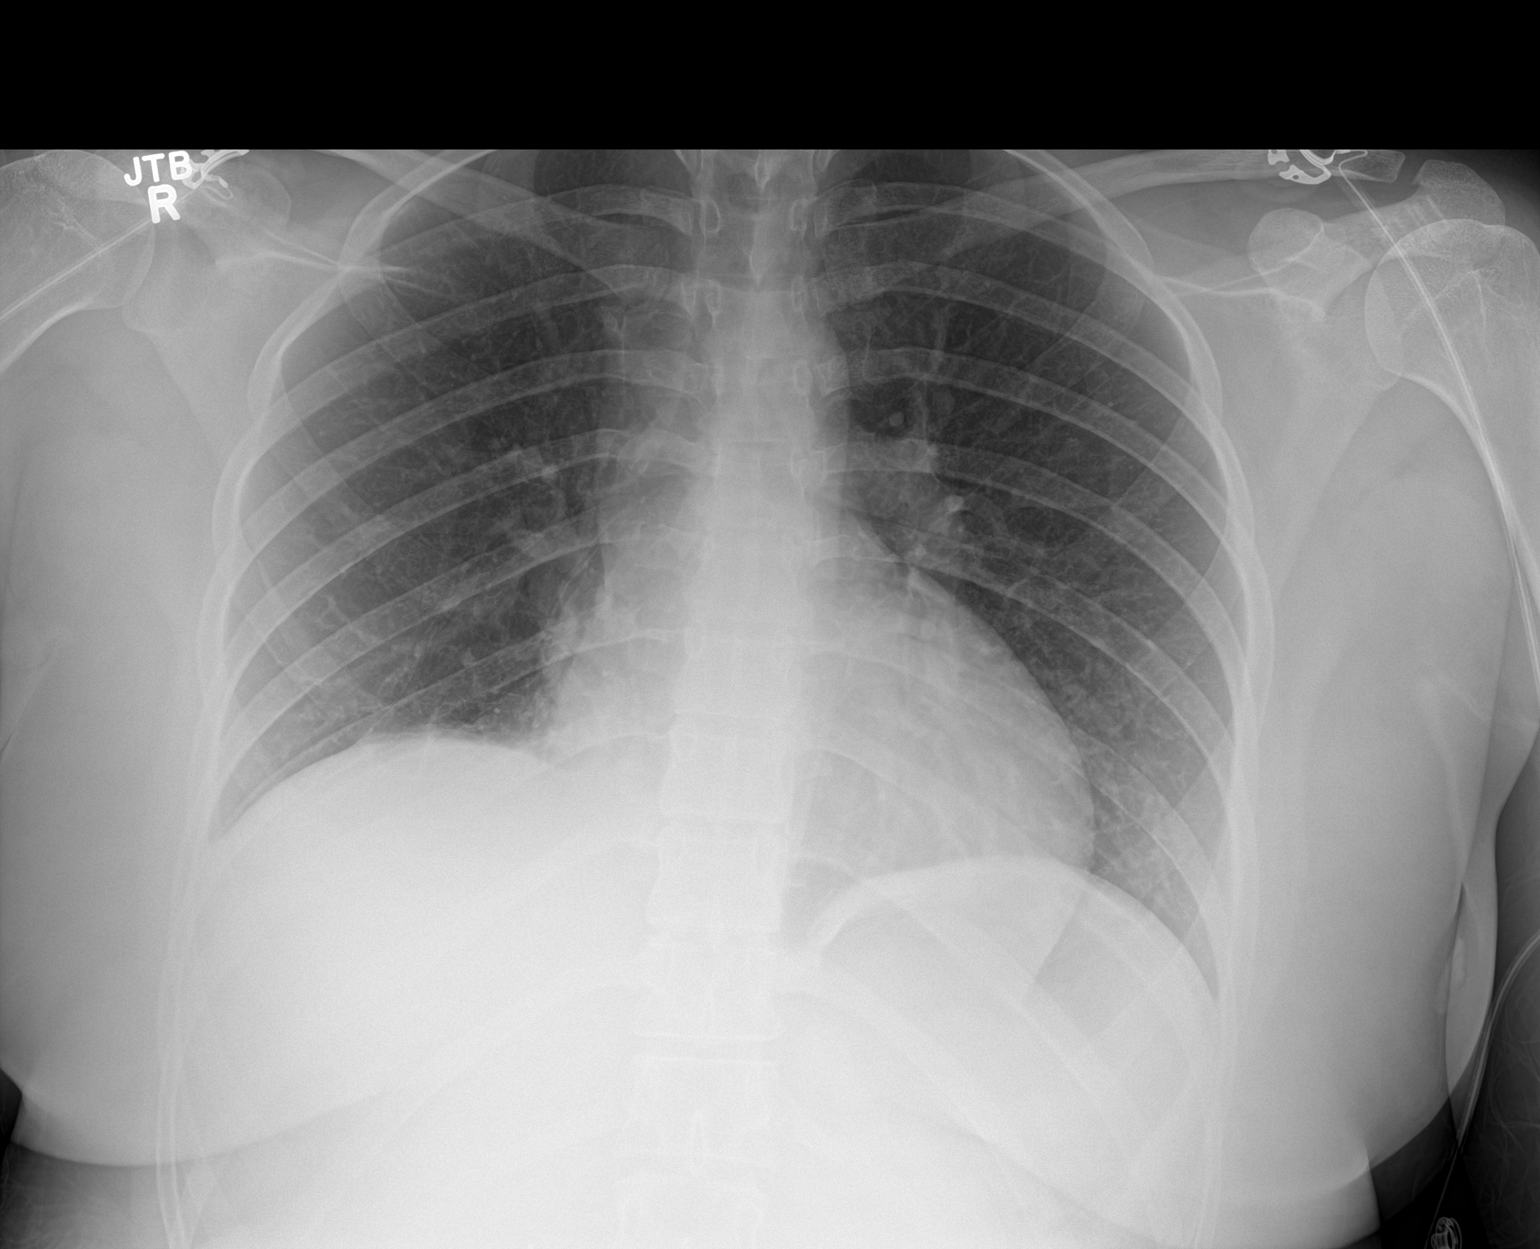

[1 of 1 positions shown; findings below may reference images not displayed]

FINDINGS: The heart, hila, mediastinum, lungs, and pleura are unremarkable. No
pneumothorax after bronchoscopy with biopsy.
IMPRESSION: No pneumothorax after bronchoscopy.  No acute abnormality.

## 2020-10-26 ENCOUNTER — Ambulatory Visit (HOSPITAL_COMMUNITY)
Admission: RE | Admit: 2020-10-26 | Discharge: 2020-10-26 | Disposition: A | Discharge status: Home / Self Care | Payer: BC Managed Care – PPO | Source: Ambulatory Visit | Attending: Physician Assistant | Admitting: Physician Assistant

## 2020-10-26 ENCOUNTER — Other Ambulatory Visit: Payer: Self-pay

## 2020-10-26 ENCOUNTER — Inpatient Hospital Stay: Payer: BC Managed Care – PPO | Attending: Internal Medicine

## 2020-10-26 DIAGNOSIS — C7A00 Malignant carcinoid tumor of unspecified site: Secondary | ICD-10-CM | POA: Diagnosis not present

## 2020-10-26 DIAGNOSIS — C7A090 Malignant carcinoid tumor of the bronchus and lung: Secondary | ICD-10-CM | POA: Insufficient documentation

## 2020-10-26 DIAGNOSIS — Z9049 Acquired absence of other specified parts of digestive tract: Secondary | ICD-10-CM | POA: Diagnosis not present

## 2020-10-26 DIAGNOSIS — E340 Carcinoid syndrome: Secondary | ICD-10-CM | POA: Diagnosis not present

## 2020-10-26 LAB — CBC WITH DIFFERENTIAL (CANCER CENTER ONLY)
Abs Immature Granulocytes: 0.02 10*3/uL (ref 0.00–0.07)
Basophils Absolute: 0 10*3/uL (ref 0.0–0.1)
Basophils Relative: 0 %
Eosinophils Absolute: 0.1 10*3/uL (ref 0.0–0.5)
Eosinophils Relative: 1 %
HCT: 41 % (ref 36.0–46.0)
Hemoglobin: 13.6 g/dL (ref 12.0–15.0)
Immature Granulocytes: 0 %
Lymphocytes Relative: 22 %
Lymphs Abs: 1.8 10*3/uL (ref 0.7–4.0)
MCH: 28.2 pg (ref 26.0–34.0)
MCHC: 33.2 g/dL (ref 30.0–36.0)
MCV: 84.9 fL (ref 80.0–100.0)
Monocytes Absolute: 0.4 10*3/uL (ref 0.1–1.0)
Monocytes Relative: 5 %
Neutro Abs: 6 10*3/uL (ref 1.7–7.7)
Neutrophils Relative %: 72 %
Platelet Count: 250 10*3/uL (ref 150–400)
RBC: 4.83 MIL/uL (ref 3.87–5.11)
RDW: 13.4 % (ref 11.5–15.5)
WBC Count: 8.3 10*3/uL (ref 4.0–10.5)
nRBC: 0 % (ref 0.0–0.2)

## 2020-10-26 LAB — CMP (CANCER CENTER ONLY)
ALT: 13 U/L (ref 0–44)
AST: 13 U/L — ABNORMAL LOW (ref 15–41)
Albumin: 4.1 g/dL (ref 3.5–5.0)
Alkaline Phosphatase: 64 U/L (ref 38–126)
Anion gap: 8 (ref 5–15)
BUN: 5 mg/dL — ABNORMAL LOW (ref 6–20)
CO2: 24 mmol/L (ref 22–32)
Calcium: 8.9 mg/dL (ref 8.9–10.3)
Chloride: 106 mmol/L (ref 98–111)
Creatinine: 0.78 mg/dL (ref 0.44–1.00)
GFR, Estimated: >60 mL/min (ref >60)
Glucose, Bld: 97 mg/dL (ref 70–99)
Potassium: 3.6 mmol/L (ref 3.5–5.1)
Sodium: 138 mmol/L (ref 135–145)
Total Bilirubin: 0.4 mg/dL (ref 0.3–1.2)
Total Protein: 7.1 g/dL (ref 6.5–8.1)

## 2020-10-26 MED ORDER — IOHEXOL 300 MG/ML  SOLN
75.0000 mL | Freq: Once | INTRAMUSCULAR | Status: AC | PRN
  Administered 2020-10-26: 75 mL via INTRAVENOUS

## 2020-10-28 ENCOUNTER — Encounter: Payer: Self-pay | Admitting: Internal Medicine

## 2020-10-28 ENCOUNTER — Other Ambulatory Visit: Payer: Self-pay

## 2020-10-28 ENCOUNTER — Inpatient Hospital Stay: Payer: BC Managed Care – PPO | Attending: Internal Medicine | Admitting: Internal Medicine

## 2020-10-28 VITALS — BP 139/81 | HR 92 | Temp 98.6°F | Resp 18 | Ht 67.0 in | Wt 220.2 lb

## 2020-10-28 DIAGNOSIS — Z85110 Personal history of malignant carcinoid tumor of bronchus and lung: Secondary | ICD-10-CM | POA: Insufficient documentation

## 2020-10-28 DIAGNOSIS — C7A090 Malignant carcinoid tumor of the bronchus and lung: Secondary | ICD-10-CM

## 2020-10-28 NOTE — Progress Notes (Signed)
[facility] [place] [phone]   [phone]  OFFICE PROGRESS NOTE  [name], PA-C [place] [address]  DIAGNOSIS: Stage IA  (T1b, N0, M0) low-grade neuroendocrine carcinoma, carcinoid tumor presented with endobronchial lesion in the bronchus intermedius. Diagnosed January 2020.  PRIOR THERAPY: 1) Status post right middle and lower bilobectomy with lymph node dissection on October 17, 2018 under the care of Dr. Roxan Hockey  CURRENT THERAPY: Observation.   INTERVAL HISTORY: Susan Elliott 25 y.o. female returns to the clinic today for annual follow-up visit.  The patient is feeling fine today with no concerning complaints.  She has occasional pain at the right side of the chest from the surgical scar.  She denied having any fever or chills.  She has no nausea, vomiting, diarrhea or constipation.  She denied having any headache or visual changes.  She has no weight loss or night sweats.  The patient had repeat CT scan of the chest performed recently and she is here for evaluation and discussion of her risk her results.  MEDICAL HISTORY: Past Medical History:  Diagnosis Date  . Asthma   . Celiac disease 2011  . Complication of anesthesia    migraine x3 days  . Dyspnea     1/ 23/2020- just since Ive had pneumonia  . Migraine   . Pneumonia 2019 aug  . Pneumonia due to Streptococcus pneumoniae ([place]) 08/09/2018    ALLERGIES:  is allergic to penicillins, gluten meal, and ultram [tramadol hcl].  MEDICATIONS:  Current Outpatient Medications  Medication Sig Dispense Refill  . Biotin 5000 MCG TABS Take 5,000 mcg by mouth daily.    . cetirizine (ZYRTEC) 10 MG tablet Take 10 mg by mouth daily.    . Multiple Vitamin (MULTIVITAMIN WITH MINERALS) TABS tablet Take 1 tablet by mouth daily.     No current facility-administered medications for this visit.    SURGICAL HISTORY:  Past Surgical History:  Procedure Laterality  Date  . CHOLECYSTECTOMY    . IR THORACENTESIS ASP PLEURAL SPACE W/IMG GUIDE  10/31/2018  . LOBECTOMY N/A 10/17/2018   Procedure: Right middle and lower  BILOBECTOMY;  Surgeon: Melrose Nakayama, MD;  Location: [facility];  Service: Thoracic;  Laterality: N/A;  . VIDEO ASSISTED THORACOSCOPY Right 10/17/2018   Procedure: VIDEO ASSISTED THORACOSCOPY with medial lymph node dissection and intercostal nerve block;  Surgeon: Melrose Nakayama, MD;  Location: [facility];  Service: Thoracic;  Laterality: Right;  Marland Kitchen VIDEO BRONCHOSCOPY N/A 09/21/2018   Procedure: VIDEO BRONCHOSCOPY WITH FORCEPS BIOPSY, right middle lung lobe;  Surgeon: Margaretha Seeds, MD;  Location: [facility];  Service: Thoracic;  Laterality: N/A;  . VIDEO BRONCHOSCOPY N/A 10/17/2018   Procedure: VIDEO BRONCHOSCOPY;  Surgeon: Melrose Nakayama, MD;  Location: [facility];  Service: Thoracic;  Laterality: N/A;  . VIDEO BRONCHOSCOPY N/A 10/22/2018   Procedure: VIDEO BRONCHOSCOPY;  Surgeon: Melrose Nakayama, MD;  [facility];  Service: Thoracic;  Laterality: N/A;    REVIEW OF SYSTEMS:  A comprehensive review of systems was negative.   PHYSICAL EXAMINATION: General appearance: alert, cooperative and no distress Head: Normocephalic, without obvious abnormality, atraumatic Neck: no adenopathy, no JVD, supple, symmetrical, trachea midline and thyroid not enlarged, symmetric, no tenderness/mass/nodules Lymph nodes: Cervical, supraclavicular, and axillary nodes normal. Resp: clear to auscultation bilaterally Back: symmetric, no curvature. ROM normal. No CVA tenderness. Cardio: regular rate and rhythm, S1, S2 normal, no murmur, click, rub or gallop GI:  soft, non-tender; bowel sounds normal; no masses,  no organomegaly Extremities: extremities normal, atraumatic, no cyanosis or edema  ECOG PERFORMANCE STATUS: 0 - Asymptomatic  Blood pressure 139/81, pulse 92, temperature 98.6 F (37 C), temperature source Tympanic, resp. rate 18, height 5\' 7"   (1.702 m), weight 220 lb 3.2 oz (99.9 kg), last menstrual period 10/26/2020, SpO2 100 %.  LABORATORY DATA: Lab Results  Component Value Date   WBC 8.3 10/26/2020   HGB 13.6 10/26/2020   HCT 41.0 10/26/2020   MCV 84.9 10/26/2020   PLT 250 10/26/2020      Chemistry      Component Value Date/Time   NA 138 10/26/2020 1130   NA 138 04/10/2019 1115   K 3.6 10/26/2020 1130   CL 106 10/26/2020 1130   CO2 24 10/26/2020 1130   BUN 5 (L) 10/26/2020 1130   BUN 7 04/10/2019 1115   CREATININE 0.78 10/26/2020 1130      Component Value Date/Time   CALCIUM 8.9 10/26/2020 1130   ALKPHOS 64 10/26/2020 1130   AST 13 (L) 10/26/2020 1130   ALT 13 10/26/2020 1130   BILITOT 0.4 10/26/2020 1130       RADIOGRAPHIC STUDIES: CT Chest W Contrast  Result Date: 10/26/2020 CLINICAL DATA:  Restaging carcinoid tumor. EXAM: CT CHEST WITH CONTRAST TECHNIQUE: Multidetector CT imaging of the chest was performed during intravenous contrast administration. CONTRAST:  88mL OMNIPAQUE IOHEXOL 300 MG/ML  SOLN COMPARISON:  10/25/2019. FINDINGS: Cardiovascular: Heart size normal.  No pericardial effusion. Mediastinum/Nodes: Thymic tissue is seen in the prevascular space. No pathologically enlarged mediastinal, hilar or axillary lymph nodes. Esophagus is grossly unremarkable. Lungs/Pleura: Right middle and right lower lobectomies. Trace chronic pleural fluid at the base of the right hemithorax. Lungs are otherwise clear. No left pleural fluid. Airway is otherwise unremarkable. Upper Abdomen: Visualized portions of the liver, adrenal glands, kidneys, spleen, pancreas, stomach and bowel are unremarkable. Cholecystectomy. No upper abdominal adenopathy. Musculoskeletal: None. IMPRESSION: 1. No evidence of recurrent disease. 2. Trace pleural fluid at the base of the right hemithorax, stable. Electronically Signed   By: Lorin Picket M.D.   On: 10/26/2020 15:28    ASSESSMENT AND PLAN: This is a very pleasant 25 years old white  female diagnosed with a stage Ia (T1b, N0, M0) low-grade neuroendocrine carcinoma, carcinoid tumor presented with endobronchial lesion in the bronchus intermedius diagnosed in January 2020 and status post right middle and lower bilobectomy with lymph node dissection on October 17, 2018 under the care of Dr. Roxan Hockey. The patient is feeling fine today with no concerning complaints. She had repeat CT scan of the chest performed recently.  I personally and independently reviewed the scans and discussed the results with the patient today. Her scan showed no concerning findings for disease recurrence or metastasis. I recommended for her to continue on observation with repeat CT scan of the chest in 1 year. She was advised to call immediately if she has any concerning symptoms in the interval. The patient voices understanding of current disease status and treatment options and is in agreement with the current care plan.  All questions were answered. The patient knows to call the clinic with any problems, questions or concerns. We can certainly see the patient much sooner if necessary.   Disclaimer: This note was dictated with voice recognition software. Similar sounding words can inadvertently be transcribed and may not be corrected upon review.

## 2020-10-30 ENCOUNTER — Other Ambulatory Visit: Payer: Self-pay

## 2020-10-30 ENCOUNTER — Ambulatory Visit (INDEPENDENT_AMBULATORY_CARE_PROVIDER_SITE_OTHER): Payer: BC Managed Care – PPO | Admitting: Obstetrics & Gynecology

## 2020-10-30 ENCOUNTER — Encounter: Payer: Self-pay | Admitting: Obstetrics & Gynecology

## 2020-10-30 ENCOUNTER — Telehealth: Payer: Self-pay | Admitting: Internal Medicine

## 2020-10-30 VITALS — BP 126/84 | Ht 67.0 in | Wt 221.0 lb

## 2020-10-30 DIAGNOSIS — Z789 Other specified health status: Secondary | ICD-10-CM

## 2020-10-30 DIAGNOSIS — Z01419 Encounter for gynecological examination (general) (routine) without abnormal findings: Secondary | ICD-10-CM

## 2020-10-30 DIAGNOSIS — Z6834 Body mass index (BMI) 34.0-34.9, adult: Secondary | ICD-10-CM | POA: Diagnosis not present

## 2020-10-30 DIAGNOSIS — E6609 Other obesity due to excess calories: Secondary | ICD-10-CM

## 2020-10-30 DIAGNOSIS — R87618 Other abnormal cytological findings on specimens from cervix uteri: Secondary | ICD-10-CM | POA: Diagnosis not present

## 2020-10-30 NOTE — Addendum Note (Signed)
Addended by: Thurnell Garbe A on: 10/30/2020 09:54 AM   Modules accepted: Orders

## 2020-10-30 NOTE — Telephone Encounter (Signed)
Scheduled per los. Called and left msg. Mailed printout  °

## 2020-10-30 NOTE — Progress Notes (Signed)
Susan Elliott 1996/05/28 [id number]   History:    25 y.o. G0 Single. 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  MC:[id number] presenting for annual gyn exam   FRT:MYTRZN regular normal every month since stopped BCPs last year. No pelvic pain.  Abstinent currently, had IC x 1 using condoms x last year.  Rt lung surgery for a Carcinoid tumor (2 lobes removed).  Followed by Oncology.  Urine and bowel movements normal. Breasts normal. Improved Body mass index to 34.61.  Improved diet. Not regularly physically active, plans to start.   Past medical history,surgical history, family history and social history were all reviewed and documented in the EPIC chart.  Gynecologic History Patient's last menstrual period was 10/26/2020 (exact date).  Obstetric History OB History  Gravida Para Term Preterm AB Living  1       1 0  SAB IAB Ectopic Multiple Live Births    1          # Outcome Date GA Lbr Len/2nd Weight Sex Delivery Anes PTL Lv  1 IAB              ROS: A ROS was performed and pertinent positives and negatives are included in the history.  GENERAL: No fevers or chills. HEENT: No change in vision, no earache, sore throat or sinus congestion. NECK: No pain or stiffness. CARDIOVASCULAR: No chest pain or pressure. No palpitations. PULMONARY: No shortness of breath, cough or wheeze. GASTROINTESTINAL: No abdominal pain, nausea, vomiting or diarrhea, melena or bright red blood per rectum. GENITOURINARY: No urinary frequency, urgency, hesitancy or dysuria. MUSCULOSKELETAL: No joint or muscle pain, no back pain, no recent trauma. DERMATOLOGIC: No rash, no itching, no lesions. ENDOCRINE: No polyuria, polydipsia, no heat or cold intolerance. No recent change in weight. HEMATOLOGICAL: No anemia or easy bruising or bleeding. NEUROLOGIC: No headache, seizures, numbness, tingling or weakness. PSYCHIATRIC: No depression, no loss of interest in normal activity or  change in sleep pattern.     Exam:   BP 126/84   Ht 5' 7"  (1.702 m)   Wt 221 lb (100.2 kg)   LMP 10/26/2020 (Exact Date)   BMI 34.61 kg/m   Body mass index is 34.61 kg/m.  General appearance : Well developed well nourished female. No acute distress HEENT: Eyes: no retinal hemorrhage or exudates,  Neck supple, trachea midline, no carotid bruits, no thyroidmegaly Lungs: Clear to auscultation, no rhonchi or wheezes, or rib retractions  Heart: Regular rate and rhythm, no murmurs or gallops Breast:Examined in sitting and supine position were symmetrical in appearance, no palpable masses or tenderness,  no skin retraction, no nipple inversion, no nipple discharge, no skin discoloration, no axillary or supraclavicular lymphadenopathy Abdomen: no palpable masses or tenderness, no rebound or guarding Extremities: no edema or skin discoloration or tenderness  Pelvic: Vulva: Normal             Vagina: No gross lesions or discharge  Cervix: No gross lesions or discharge.  Pap reflex done.  Uterus  AV, normal size, shape and consistency, non-tender and mobile  Adnexa  Without masses or tenderness  Anus: Normal   Assessment/Plan:  25 y.o. female for annual exam   1. Encounter for routine gynecological examination with Papanicolaou smear of cervix Normal gynecologic exam.  Pap reflex done.  Breast exam normal.  2. Use of condoms for contraception  3. Class 1 obesity due to excess calories without serious comorbidity with body mass index (BMI) of  34.0 to 34.9 in adult Much improved body mass index since last year on a low calorie/carb diet.  Patient will increase fitness activities.  Princess Bruins MD, 8:49 AM 10/30/2020

## 2020-11-02 LAB — PAP IG W/ RFLX HPV ASCU

## 2020-12-28 ENCOUNTER — Encounter: Payer: Self-pay | Admitting: Obstetrics & Gynecology

## 2020-12-28 ENCOUNTER — Other Ambulatory Visit: Payer: Self-pay

## 2020-12-28 ENCOUNTER — Ambulatory Visit: Payer: BC Managed Care – PPO | Admitting: Obstetrics & Gynecology

## 2020-12-28 VITALS — BP 124/80

## 2020-12-28 DIAGNOSIS — N898 Other specified noninflammatory disorders of vagina: Secondary | ICD-10-CM

## 2020-12-28 LAB — WET PREP FOR TRICH, YEAST, CLUE

## 2020-12-28 MED ORDER — FLUCONAZOLE 150 MG PO TABS: 150.0000 mg | ORAL_TABLET | Freq: Every day | ORAL | 2 refills | Status: AC

## 2020-12-28 NOTE — Progress Notes (Signed)
    Susan Elliott 12-25-1995 [id number]        25 y.o.  G1P0010   RP: Vaginal itching  HPI: C/O vaginal itching intermittently x a few months.  Treated with Monistat, which helped, but the symptoms eventually come back.  No vaginal discharge currently.  No pelvic pain.  Abstinent.  Urine/BMs normal.  No fever.   OB History  Gravida Para Term Preterm AB Living  1       1 0  SAB IAB Ectopic Multiple Live Births    1          # Outcome Date GA Lbr Len/2nd Weight Sex Delivery Anes PTL Lv  1 IAB             Past medical history,surgical history, problem list, medications, allergies, family history and social history were all reviewed and documented in the EPIC chart.   Directed ROS with pertinent positives and negatives documented in the history of present illness/assessment and plan.  Exam:  Vitals:   12/28/20 1353  BP: 124/80   General appearance:  Normal  Abdomen: Normal  Gynecologic exam: Vulva normal.  Speculum:  Cervix/Vagina normal.  Mild dark menstrual blood.  Wet prep done.  Wet prep: Negative   Assessment/Plan:  25 y.o. G1P0010   1. Itching in the vaginal area No yeast vaginitis documented by wet prep today, but recent treatment with Monistat.  Decision to send a prescription of Fluconazole.  Will take one tab now.  If symptoms recur, will take 1 tab PO daily x 3 days.  Usage reviewed. - WET PREP FOR [place], YEAST, CLUE  Other orders - fluconazole (DIFLUCAN) 150 MG tablet; Take 1 tablet (150 mg total) by mouth daily for 3 days.  Princess Bruins MD, 2:00 PM 12/28/2020

## 2020-12-29 ENCOUNTER — Encounter: Payer: Self-pay | Admitting: Obstetrics & Gynecology

## 2021-02-03 DIAGNOSIS — Z0000 Encounter for general adult medical examination without abnormal findings: Secondary | ICD-10-CM | POA: Diagnosis not present

## 2021-06-29 DIAGNOSIS — Z0000 Encounter for general adult medical examination without abnormal findings: Secondary | ICD-10-CM | POA: Diagnosis not present

## 2021-06-29 DIAGNOSIS — Z13220 Encounter for screening for lipoid disorders: Secondary | ICD-10-CM | POA: Diagnosis not present

## 2021-08-02 DIAGNOSIS — R059 Cough, unspecified: Secondary | ICD-10-CM | POA: Diagnosis not present

## 2021-10-13 ENCOUNTER — Telehealth: Payer: Self-pay

## 2021-10-13 NOTE — Telephone Encounter (Signed)
Patient called wanting to know if we could start her IV and draw her labs here at the [facility] before she goes for her CT scan at [facility] next week. I verified with Lexine Baton, charge RN if we could accommodate this. An appointment was made for her to come in at 0830 on 10/22/21. I informed the patient of this and she verbalized understanding. She had no other questions or concerns.

## 2021-10-22 ENCOUNTER — Inpatient Hospital Stay: Payer: BC Managed Care – PPO

## 2021-10-22 ENCOUNTER — Inpatient Hospital Stay: Payer: BC Managed Care – PPO | Attending: Internal Medicine

## 2021-10-22 ENCOUNTER — Other Ambulatory Visit: Payer: Self-pay

## 2021-10-22 ENCOUNTER — Ambulatory Visit (HOSPITAL_COMMUNITY)
Admission: RE | Admit: 2021-10-22 | Discharge: 2021-10-22 | Disposition: A | Discharge status: Home / Self Care | Payer: BC Managed Care – PPO | Source: Ambulatory Visit | Attending: Internal Medicine | Admitting: Internal Medicine

## 2021-10-22 ENCOUNTER — Encounter (HOSPITAL_COMMUNITY): Payer: Self-pay

## 2021-10-22 DIAGNOSIS — C7A090 Malignant carcinoid tumor of the bronchus and lung: Secondary | ICD-10-CM | POA: Insufficient documentation

## 2021-10-22 DIAGNOSIS — C3490 Malignant neoplasm of unspecified part of unspecified bronchus or lung: Secondary | ICD-10-CM | POA: Diagnosis not present

## 2021-10-22 LAB — CBC WITH DIFFERENTIAL (CANCER CENTER ONLY)
Abs Immature Granulocytes: 0.02 10*3/uL (ref 0.00–0.07)
Basophils Absolute: 0 10*3/uL (ref 0.0–0.1)
Basophils Relative: 0 %
Eosinophils Absolute: 0.2 10*3/uL (ref 0.0–0.5)
Eosinophils Relative: 3 %
HCT: 44.2 % (ref 36.0–46.0)
Hemoglobin: 14.7 g/dL (ref 12.0–15.0)
Immature Granulocytes: 0 %
Lymphocytes Relative: 32 %
Lymphs Abs: 2.1 10*3/uL (ref 0.7–4.0)
MCH: 28.7 pg (ref 26.0–34.0)
MCHC: 33.3 g/dL (ref 30.0–36.0)
MCV: 86.2 fL (ref 80.0–100.0)
Monocytes Absolute: 0.3 10*3/uL (ref 0.1–1.0)
Monocytes Relative: 5 %
Neutro Abs: 3.9 10*3/uL (ref 1.7–7.7)
Neutrophils Relative %: 60 %
Platelet Count: 249 10*3/uL (ref 150–400)
RBC: 5.13 MIL/uL — ABNORMAL HIGH (ref 3.87–5.11)
RDW: 13 % (ref 11.5–15.5)
WBC Count: 6.6 10*3/uL (ref 4.0–10.5)
nRBC: 0 % (ref 0.0–0.2)

## 2021-10-22 LAB — CMP (CANCER CENTER ONLY)
ALT: 13 U/L (ref 0–44)
AST: 17 U/L (ref 15–41)
Albumin: 4.6 g/dL (ref 3.5–5.0)
Alkaline Phosphatase: 62 U/L (ref 38–126)
Anion gap: 7 (ref 5–15)
BUN: 15 mg/dL (ref 6–20)
CO2: 25 mmol/L (ref 22–32)
Calcium: 9.5 mg/dL (ref 8.9–10.3)
Chloride: 105 mmol/L (ref 98–111)
Creatinine: 0.78 mg/dL (ref 0.44–1.00)
GFR, Estimated: >60 mL/min (ref >60)
Glucose, Bld: 92 mg/dL (ref 70–99)
Potassium: 3.9 mmol/L (ref 3.5–5.1)
Sodium: 137 mmol/L (ref 135–145)
Total Bilirubin: 0.7 mg/dL (ref 0.3–1.2)
Total Protein: 7.6 g/dL (ref 6.5–8.1)

## 2021-10-22 MED ORDER — IOHEXOL 300 MG/ML  SOLN
75.0000 mL | Freq: Once | INTRAMUSCULAR | Status: AC | PRN
  Administered 2021-10-22: 75 mL via INTRAVENOUS

## 2021-10-22 MED ORDER — SODIUM CHLORIDE (PF) 0.9 % IJ SOLN
INTRAMUSCULAR | Status: AC
  Filled 2021-10-22: qty 50

## 2021-10-22 NOTE — Progress Notes (Signed)
22G IV started on right forearm for CT scan, blood return noted, saline locked. Patient discharged with IV intact.

## 2021-10-26 ENCOUNTER — Other Ambulatory Visit: Payer: Self-pay

## 2021-10-26 ENCOUNTER — Encounter: Payer: Self-pay | Admitting: Internal Medicine

## 2021-10-26 ENCOUNTER — Inpatient Hospital Stay: Payer: BC Managed Care – PPO | Admitting: Internal Medicine

## 2021-10-26 VITALS — BP 144/86 | HR 78 | Temp 98.2°F | Resp 19 | Ht 67.0 in | Wt 205.2 lb

## 2021-10-26 DIAGNOSIS — C7A090 Malignant carcinoid tumor of the bronchus and lung: Secondary | ICD-10-CM

## 2021-10-26 NOTE — Progress Notes (Signed)
[place] Telephone:(336) [phone]   Fax:(336) [phone]  OFFICE PROGRESS NOTE  Susan Stalker, PA-C [place] [address]  DIAGNOSIS: Stage IA  (T1b, N0, M0) low-grade neuroendocrine carcinoma, carcinoid tumor presented with endobronchial lesion in the bronchus intermedius. Diagnosed January 2020.   PRIOR THERAPY: 1) Status post right middle and lower bilobectomy with lymph node dissection on October 17, 2018 under the care of Dr. Roxan Hockey   CURRENT THERAPY: Observation.   INTERVAL HISTORY: Susan Elliott 26 y.o. female returns to the clinic today for follow-up visit accompanied by her mother.  The patient is feeling fine today with no concerning complaints except for occasional pain on the right side of the chest from the surgical scar.  She denied having any cough, shortness of breath or hemoptysis.  She denied having any fever or chills.  She has no nausea, vomiting, diarrhea or constipation.  She denied having any weight loss or night sweats.  She had repeat CT scan of the chest performed recently and she is here for evaluation and discussion of her scan results.  MEDICAL HISTORY: Past Medical History:  Diagnosis Date   Asthma    Celiac disease 5397   Complication of anesthesia    migraine x3 days   Dyspnea     1/ 23/2020- just since Ive had pneumonia   Migraine    Pneumonia 2019 aug   Pneumonia due to Streptococcus pneumoniae ([place]) 08/09/2018    ALLERGIES:  is allergic to penicillins, gluten meal, and ultram [tramadol hcl].  MEDICATIONS:  Current Outpatient Medications  Medication Sig Dispense Refill   Biotin 5000 MCG TABS Take 5,000 mcg by mouth daily.     cetirizine (ZYRTEC) 10 MG tablet Take 10 mg by mouth daily.     Multiple Vitamin (MULTIVITAMIN WITH MINERALS) TABS tablet Take 1 tablet by mouth daily.     No current facility-administered medications for this visit.    SURGICAL HISTORY:  Past Surgical History:   Procedure Laterality Date   CHOLECYSTECTOMY     IR THORACENTESIS ASP PLEURAL SPACE W/IMG GUIDE  10/31/2018   LOBECTOMY N/A 10/17/2018   Procedure: Right middle and lower  BILOBECTOMY;  Surgeon: Melrose Nakayama, MD;  Location: [facility];  Service: Thoracic;  Laterality: N/A;   VIDEO ASSISTED THORACOSCOPY Right 10/17/2018   Procedure: VIDEO ASSISTED THORACOSCOPY with medial lymph node dissection and intercostal nerve block;  Surgeon: Melrose Nakayama, MD;  Location: [facility];  Service: Thoracic;  Laterality: Right;   VIDEO BRONCHOSCOPY N/A 09/21/2018   Procedure: VIDEO BRONCHOSCOPY WITH FORCEPS BIOPSY, right middle lung lobe;  Surgeon: Margaretha Seeds, MD;  Location: [facility];  Service: Thoracic;  Laterality: N/A;   VIDEO BRONCHOSCOPY N/A 10/17/2018   Procedure: VIDEO BRONCHOSCOPY;  Surgeon: Melrose Nakayama, MD;  Location: [facility];  Service: Thoracic;  Laterality: N/A;   VIDEO BRONCHOSCOPY N/A 10/22/2018   Procedure: VIDEO BRONCHOSCOPY;  Surgeon: Melrose Nakayama, MD;  Location: [facility];  Service: Thoracic;  Laterality: N/A;    REVIEW OF SYSTEMS:  A comprehensive review of systems was negative.   PHYSICAL EXAMINATION: General appearance: alert, cooperative, and no distress Head: Normocephalic, without obvious abnormality, atraumatic Neck: no adenopathy, no JVD, supple, symmetrical, trachea midline, and thyroid not enlarged, symmetric, no tenderness/mass/nodules Lymph nodes: Cervical, supraclavicular, and axillary nodes normal. Resp: clear to auscultation bilaterally Back: symmetric, no curvature. ROM normal. No CVA tenderness. Cardio: regular rate and rhythm, S1, S2 normal, no murmur,  click, rub or gallop GI: soft, non-tender; bowel sounds normal; no masses,  no organomegaly Extremities: extremities normal, atraumatic, no cyanosis or edema  ECOG PERFORMANCE STATUS: 0 - Asymptomatic  Blood pressure (!) 144/86, pulse 78, temperature 98.2 F (36.8 C), temperature source Tympanic, resp.  rate 19, height 5\' 7"  (1.702 m), weight 205 lb 3.2 oz (93.1 kg), SpO2 100 %.  LABORATORY DATA: Lab Results  Component Value Date   WBC 6.6 10/22/2021   HGB 14.7 10/22/2021   HCT 44.2 10/22/2021   MCV 86.2 10/22/2021   PLT 249 10/22/2021      Chemistry      Component Value Date/Time   NA 137 10/22/2021 0902   NA 138 04/10/2019 1115   K 3.9 10/22/2021 0902   CL 105 10/22/2021 0902   CO2 25 10/22/2021 0902   BUN 15 10/22/2021 0902   BUN 7 04/10/2019 1115   CREATININE 0.78 10/22/2021 0902      Component Value Date/Time   CALCIUM 9.5 10/22/2021 0902   ALKPHOS 62 10/22/2021 0902   AST 17 10/22/2021 0902   ALT 13 10/22/2021 0902   BILITOT 0.7 10/22/2021 0902       RADIOGRAPHIC STUDIES: CT Chest W Contrast  Result Date: 10/25/2021 CLINICAL DATA:  Non-small-cell lung cancer. Carcinoid tumor of the lung. Restaging. EXAM: CT CHEST WITH CONTRAST TECHNIQUE: Multidetector CT imaging of the chest was performed during intravenous contrast administration. RADIATION DOSE REDUCTION: This exam was performed according to the departmental dose-optimization program which includes automated exposure control, adjustment of the mA and/or kV according to patient size and/or use of iterative reconstruction technique. CONTRAST:  40mL OMNIPAQUE IOHEXOL 300 MG/ML  SOLN COMPARISON:  10/26/2020 FINDINGS: Cardiovascular: The heart size is normal. No substantial pericardial effusion. No thoracic aortic aneurysm. No substantial atherosclerosis of the thoracic aorta. Mediastinum/Nodes: Stable wispy soft tissue attenuation anterior mediastinum compatible with thymic remnant. No mediastinal lymphadenopathy. There is no hilar lymphadenopathy. The esophagus has normal imaging features. There is no axillary lymphadenopathy. Lungs/Pleura: Volume loss right hemithorax compatible with right middle and lower lobectomies. No new suspicious pulmonary nodule or mass. No focal airspace consolidation. There is no evidence of  pleural effusion. Upper Abdomen: The visualized upper abdomen is unremarkable. Musculoskeletal: No worrisome lytic or sclerotic osseous abnormality. IMPRESSION: 1. Stable exam. No new or progressive findings to suggest recurrent or metastatic disease. 2. Status post right middle and lower lobectomies. Electronically Signed   By: Misty Stanley M.D.   On: 10/25/2021 11:01     ASSESSMENT AND PLAN: This is a very pleasant 26 years old white female diagnosed with stage IA (T1b, N0, M0) low-grade neuroendocrine carcinoma, carcinoid tumor presented with endobronchial lesion in the bronchus intermedius diagnosed in January 2020 and status post right middle and lower bilobectomy with lymph node dissection on October 17, 2018 under the care of Dr. Roxan Hockey. The patient is currently on observation and she is doing fine with no concerning complaints. Repeat CT scan of the chest performed recently showed no concerning findings for disease recurrence or metastasis. I recommended for her to continue on observation with repeat CT scan of the chest in 1 year. The patient was advised to call immediately if she has any other concerning symptoms in the interval. The patient voices understanding of current disease status and treatment options and is in agreement with the current care plan.  All questions were answered. The patient knows to call the clinic with any problems, questions or concerns. We can certainly see the  patient much sooner if necessary.   Disclaimer: This note was dictated with voice recognition software. Similar sounding words can inadvertently be transcribed and may not be corrected upon review.

## 2021-11-04 ENCOUNTER — Other Ambulatory Visit: Payer: Self-pay

## 2021-11-04 ENCOUNTER — Ambulatory Visit (INDEPENDENT_AMBULATORY_CARE_PROVIDER_SITE_OTHER): Payer: BC Managed Care – PPO | Admitting: Obstetrics & Gynecology

## 2021-11-04 ENCOUNTER — Other Ambulatory Visit (HOSPITAL_COMMUNITY)
Admission: RE | Admit: 2021-11-04 | Discharge: 2021-11-04 | Disposition: A | Discharge status: Home / Self Care | Payer: BC Managed Care – PPO | Source: Ambulatory Visit | Attending: Obstetrics & Gynecology | Admitting: Obstetrics & Gynecology

## 2021-11-04 ENCOUNTER — Encounter: Payer: Self-pay | Admitting: Obstetrics & Gynecology

## 2021-11-04 VITALS — BP 118/78 | HR 70 | Resp 16 | Ht 66.75 in | Wt 208.0 lb

## 2021-11-04 DIAGNOSIS — Z6832 Body mass index (BMI) 32.0-32.9, adult: Secondary | ICD-10-CM | POA: Diagnosis not present

## 2021-11-04 DIAGNOSIS — Z789 Other specified health status: Secondary | ICD-10-CM | POA: Diagnosis not present

## 2021-11-04 DIAGNOSIS — E6609 Other obesity due to excess calories: Secondary | ICD-10-CM

## 2021-11-04 DIAGNOSIS — Z01419 Encounter for gynecological examination (general) (routine) without abnormal findings: Secondary | ICD-10-CM | POA: Diagnosis not present

## 2021-11-04 NOTE — Progress Notes (Signed)
? ? ?Susan Elliott 10-05-1995 [id number] ? ? ?History:    26 y.o. G0 Single.  Works at [employer]. ?  ?RP:  Established patient presenting for annual gyn exam  ?  ?HPI: Menses regular normal every month.  No BTB. No pelvic pain.  Abstinent currently.  Pap 08/2020 Negative.  Pap reflex today.  Rt lung surgery for a Carcinoid tumor (2 lobes removed). Followed by Oncology.   Urine and bowel movements normal.  Breasts normal.  Improved Body mass index improved to 32.82.  Improved diet.  Gym 4 times a week.  Labs at work normal.   ?  ? ?Past medical history,surgical history, family history and social history were all reviewed and documented in the EPIC chart. ? ?Gynecologic History ?Patient's last menstrual period was 10/27/2021 (exact date). ? ?Obstetric History ?OB History  ?Gravida Para Term Preterm AB Living  ?1       1 0  ?SAB IAB Ectopic Multiple Live Births  ?  1        ?  ?# Outcome Date GA Lbr Len/2nd Weight Sex Delivery Anes PTL Lv  ?1 IAB           ? ? ? ?ROS: A ROS was performed and pertinent positives and negatives are included in the history. ? GENERAL: No fevers or chills. HEENT: No change in vision, no earache, sore throat or sinus congestion. NECK: No pain or stiffness. CARDIOVASCULAR: No chest pain or pressure. No palpitations. PULMONARY: No shortness of breath, cough or wheeze. GASTROINTESTINAL: No abdominal pain, nausea, vomiting or diarrhea, melena or bright red blood per rectum. GENITOURINARY: No urinary frequency, urgency, hesitancy or dysuria. MUSCULOSKELETAL: No joint or muscle pain, no back pain, no recent trauma. DERMATOLOGIC: No rash, no itching, no lesions. ENDOCRINE: No polyuria, polydipsia, no heat or cold intolerance. No recent change in weight. HEMATOLOGICAL: No anemia or easy bruising or bleeding. NEUROLOGIC: No headache, seizures, numbness, tingling or weakness. PSYCHIATRIC: No depression, no loss of interest in normal activity or change in sleep pattern.  ?  ? ?Exam: ? ? ?BP 118/78    Pulse 70   Resp 16   Ht 5' 6.75" (1.695 m)   Wt 208 lb (94.3 kg)   LMP 10/27/2021 (Exact Date)   BMI 32.82 kg/m?  ? ?Body mass index is 32.82 kg/m?. ? ?General appearance : Well developed well nourished female. No acute distress ?HEENT: Eyes: no retinal hemorrhage or exudates,  Neck supple, trachea midline, no carotid bruits, no thyroidmegaly ?Lungs: Clear to auscultation, no rhonchi or wheezes, or rib retractions  ?Heart: Regular rate and rhythm, no murmurs or gallops ?Breast:Examined in sitting and supine position were symmetrical in appearance, no palpable masses or tenderness,  no skin retraction, no nipple inversion, no nipple discharge, no skin discoloration, no axillary or supraclavicular lymphadenopathy ?Abdomen: no palpable masses or tenderness, no rebound or guarding ?Extremities: no edema or skin discoloration or tenderness ? ?Pelvic: Vulva: Normal ?            Vagina: No gross lesions or discharge ? Cervix: No gross lesions or discharge.  Pap reflex done. ? Uterus  AV, normal size, shape and consistency, non-tender and mobile ? Adnexa  Without masses or tenderness ? Anus: Normal ? ? ?Assessment/Plan:  26 y.o. female for annual exam  ? ?1. Encounter for routine gynecological examination with Papanicolaou smear of cervix ?Menses regular normal every month.  No BTB. No pelvic pain.  Abstinent currently.  Pap 08/2020 Negative.  Pap  reflex today.  Rt lung surgery for a Carcinoid tumor (2 lobes removed). Followed by Oncology.   Urine and bowel movements normal.  Breasts normal.  Improved Body mass index improved to 32.82.  Improved diet.  Gym 4 times a week.  Labs at work normal.   ?- Cytology - PAP( [place]) ? ?2. Use of condoms for contraception ?Currently abstinent. ? ?3. Class 1 obesity due to excess calories without serious comorbidity with body mass index (BMI) of 32.0 to 32.9 in adult  ?Improved BMI x last year.  Continue with low calorie/carb diet.  Gym 4 times a week. ? ?Princess Bruins  MD, 8:38 AM 11/04/2021 ? ?  ?

## 2021-11-08 LAB — CYTOLOGY - PAP: Diagnosis: NEGATIVE

## 2022-03-10 DIAGNOSIS — Z0000 Encounter for general adult medical examination without abnormal findings: Secondary | ICD-10-CM | POA: Diagnosis not present

## 2022-03-29 DIAGNOSIS — R635 Abnormal weight gain: Secondary | ICD-10-CM | POA: Diagnosis not present

## 2022-03-29 DIAGNOSIS — Z13220 Encounter for screening for lipoid disorders: Secondary | ICD-10-CM | POA: Diagnosis not present

## 2022-03-29 DIAGNOSIS — Z0000 Encounter for general adult medical examination without abnormal findings: Secondary | ICD-10-CM | POA: Diagnosis not present

## 2022-04-20 ENCOUNTER — Telehealth: Payer: Self-pay | Admitting: Internal Medicine

## 2022-04-20 NOTE — Telephone Encounter (Signed)
Called patient regarding upcoming 2024 appointment, left a voicemail.

## 2022-05-24 DIAGNOSIS — R059 Cough, unspecified: Secondary | ICD-10-CM | POA: Diagnosis not present

## 2022-10-04 ENCOUNTER — Other Ambulatory Visit: Payer: Self-pay | Admitting: Internal Medicine

## 2022-10-04 DIAGNOSIS — C7A090 Malignant carcinoid tumor of the bronchus and lung: Secondary | ICD-10-CM

## 2022-10-24 ENCOUNTER — Other Ambulatory Visit: Payer: BC Managed Care – PPO

## 2022-10-25 DIAGNOSIS — J069 Acute upper respiratory infection, unspecified: Secondary | ICD-10-CM | POA: Diagnosis not present

## 2022-10-26 ENCOUNTER — Ambulatory Visit: Payer: BC Managed Care – PPO | Admitting: Internal Medicine

## 2022-11-10 ENCOUNTER — Encounter: Payer: Self-pay | Admitting: Obstetrics & Gynecology

## 2022-11-10 ENCOUNTER — Ambulatory Visit (INDEPENDENT_AMBULATORY_CARE_PROVIDER_SITE_OTHER): Payer: BC Managed Care – PPO | Admitting: Obstetrics & Gynecology

## 2022-11-10 ENCOUNTER — Other Ambulatory Visit (HOSPITAL_COMMUNITY)
Admission: RE | Admit: 2022-11-10 | Discharge: 2022-11-10 | Disposition: A | Discharge status: Home / Self Care | Payer: BC Managed Care – PPO | Source: Ambulatory Visit | Attending: Obstetrics & Gynecology | Admitting: Obstetrics & Gynecology

## 2022-11-10 VITALS — BP 112/78 | HR 105 | Ht 66.5 in | Wt 192.0 lb

## 2022-11-10 DIAGNOSIS — Z789 Other specified health status: Secondary | ICD-10-CM

## 2022-11-10 DIAGNOSIS — Z01419 Encounter for gynecological examination (general) (routine) without abnormal findings: Secondary | ICD-10-CM

## 2022-11-10 DIAGNOSIS — L700 Acne vulgaris: Secondary | ICD-10-CM | POA: Diagnosis not present

## 2022-11-10 NOTE — Progress Notes (Signed)
Susan Elliott Nov 17, 1995 [id number]   History:    27 y.o. G0 Single.  Works at [employer].   RP:  Established patient presenting for annual gyn exam    HPI: Menses regular normal every month.  No BTB. No pelvic pain. Pt c/o hormonal acne. Rt lung surgery for a Carcinoid tumor (2 lobes removed). Followed by Oncology.  Abstinent currently. PAP Neg on 11-04-21. Pap reflex/Gono-Chlam today. Declines full STI screen.  Urine and bowel movements normal.  Breasts normal. Improved Body mass index improved to 30.53.  Improved diet.  Gym 4 times a week.  Labs at work normal.     Past medical history,surgical history, family history and social history were all reviewed and documented in the EPIC chart.  Gynecologic History Patient's last menstrual period was 10/27/2022 (exact date).  Obstetric History OB History  Gravida Para Term Preterm AB Living  1       1 0  SAB IAB Ectopic Multiple Live Births    1          # Outcome Date GA Lbr Len/2nd Weight Sex Delivery Anes PTL Lv  1 IAB              ROS: A ROS was performed and pertinent positives and negatives are included in the history. GENERAL: No fevers or chills. HEENT: No change in vision, no earache, sore throat or sinus congestion. NECK: No pain or stiffness. CARDIOVASCULAR: No chest pain or pressure. No palpitations. PULMONARY: No shortness of breath, cough or wheeze. GASTROINTESTINAL: No abdominal pain, nausea, vomiting or diarrhea, melena or bright red blood per rectum. GENITOURINARY: No urinary frequency, urgency, hesitancy or dysuria. MUSCULOSKELETAL: No joint or muscle pain, no back pain, no recent trauma. DERMATOLOGIC: No rash, no itching, no lesions. ENDOCRINE: No polyuria, polydipsia, no heat or cold intolerance. No recent change in weight. HEMATOLOGICAL: No anemia or easy bruising or bleeding. NEUROLOGIC: No headache, seizures, numbness, tingling or weakness. PSYCHIATRIC: No depression, no loss of interest in normal activity or  change in sleep pattern.     Exam:   BP 112/78   Pulse (!) 105   Ht 5' 6.5" (1.689 m)   Wt 192 lb (87.1 kg)   LMP 10/27/2022 (Exact Date) Comment: not sexually active  SpO2 99%   BMI 30.53 kg/m   Body mass index is 30.53 kg/m.  General appearance : Well developed well nourished female. No acute distress HEENT: Eyes: no retinal hemorrhage or exudates,  Neck supple, trachea midline, no carotid bruits, no thyroidmegaly Lungs: Clear to auscultation, no rhonchi or wheezes, or rib retractions  Heart: Regular rate and rhythm, no murmurs or gallops Breast:Examined in sitting and supine position were symmetrical in appearance, no palpable masses or tenderness,  no skin retraction, no nipple inversion, no nipple discharge, no skin discoloration, no axillary or supraclavicular lymphadenopathy Abdomen: no palpable masses or tenderness, no rebound or guarding Extremities: no edema or skin discoloration or tenderness  Pelvic: Vulva: Normal             Vagina: No gross lesions or discharge  Cervix: No gross lesions or discharge.  Pap reflex/Gono-Chlam done.  Uterus  AV, normal size, shape and consistency, non-tender and mobile  Adnexa  Without masses or tenderness  Anus: Normal   Assessment/Plan:  27 y.o. female for annual exam   1. Encounter for routine gynecological examination with Papanicolaou smear of cervix Menses regular normal every month.  No BTB. No pelvic pain. Pt c/o hormonal acne.  Rt lung surgery for a Carcinoid tumor (2 lobes removed). Followed by Oncology.  Abstinent currently. PAP Neg on 11-04-21. Pap reflex/Gono-Chlam today. Declines full STI screen.  Urine and bowel movements normal.  Breasts normal. Improved Body mass index improved to 30.53.  Improved diet.  Gym 4 times a week.  Labs at work normal.   - Cytology - PAP( [place])  2. Use of condoms for contraception Declines other contraceptive methods at this time.  Given her h/o Carcinoid tumor, suggested  Drospirenone-only BCP.  Patient with consult with her Oncologist for recommendation.   3. Acne vulgaris  Hormonal acne.  Given her h/o Carcinoid tumor, suggested Drospirenone-only BCP.  Patient with consult with her Oncologist for recommendation.  Will also schedule a Dermato appointment.  Princess Bruins MD, 8:14 AM

## 2022-11-16 LAB — CYTOLOGY - PAP
Chlamydia: NEGATIVE
Comment: NEGATIVE
Comment: NORMAL
Diagnosis: NEGATIVE
Diagnosis: REACTIVE
Neisseria Gonorrhea: NEGATIVE

## 2023-02-06 DIAGNOSIS — N760 Acute vaginitis: Secondary | ICD-10-CM | POA: Diagnosis not present

## 2023-02-06 DIAGNOSIS — R350 Frequency of micturition: Secondary | ICD-10-CM | POA: Diagnosis not present

## 2023-02-13 ENCOUNTER — Ambulatory Visit: Payer: BC Managed Care – PPO | Admitting: Nurse Practitioner

## 2023-02-13 ENCOUNTER — Ambulatory Visit: Payer: BC Managed Care – PPO | Admitting: Radiology

## 2023-02-13 ENCOUNTER — Encounter: Payer: Self-pay | Admitting: Radiology

## 2023-02-13 VITALS — BP 122/84

## 2023-02-13 DIAGNOSIS — Z113 Encounter for screening for infections with a predominantly sexual mode of transmission: Secondary | ICD-10-CM

## 2023-02-13 DIAGNOSIS — N761 Subacute and chronic vaginitis: Secondary | ICD-10-CM | POA: Diagnosis not present

## 2023-02-13 DIAGNOSIS — R350 Frequency of micturition: Secondary | ICD-10-CM | POA: Diagnosis not present

## 2023-02-13 LAB — WET PREP FOR TRICH, YEAST, CLUE

## 2023-02-13 NOTE — Progress Notes (Signed)
      Subjective: Susan Elliott is a 27 y.o. female who complains of  - Sexually active with 1 new female partner(s) - Last sexual encounter: 2 days ago -Contraception: condoms  Review of Systems  All other systems reviewed and are negative.   Past Medical History:  Diagnosis Date   Asthma    Celiac disease 2011   Complication of anesthesia    migraine x3 days   Dyspnea     1/ 23/2020- just since Ive had pneumonia   Migraine    Pneumonia 2019 aug   Pneumonia due to Streptococcus pneumoniae (HCC) 08/09/2018      Objective:  Today's Vitals   02/13/23 1517  BP: 122/84   There is no height or weight on file to calculate BMI.   -General: no acute distress -Vulva: without lesions or discharge -Vagina: discharge present, aptima swab and wet prep obtained -Cervix: no lesion or discharge, no CMT -Perineum: no lesions -Uterus: Mobile, non tender -Adnexa: no masses or tenderness  Urine dipstick shows negative for all components.  Micro exam: negative for WBC's or RBC's.  Microscopic wet-mount exam shows negative for pathogens, normal epithelial cells.   Raynelle Fanning, CMA present for exam  Assessment:/Plan:   1. Urinary frequency - Urinalysis,Complete w/RFL Culture  2. Subacute vaginitis - Mycoplasma / ureaplasma culture - WET PREP FOR TRICH, YEAST, CLUE  3. Screening for STDs (sexually transmitted diseases) - SURESWAB CT/NG/T. vaginalis    Will contact patient with results of testing completed today. Avoid intercourse until symptoms are resolved. Safe sex encouraged. Avoid the use of soaps or perfumed products in the peri area. Avoid tub baths and sitting in sweaty or wet clothing for prolonged periods of time.

## 2023-02-14 LAB — SURESWAB CT/NG/T. VAGINALIS
C. trachomatis RNA, TMA: NOT DETECTED
N. gonorrhoeae RNA, TMA: NOT DETECTED
Trichomonas vaginalis RNA: NOT DETECTED

## 2023-02-16 ENCOUNTER — Other Ambulatory Visit: Payer: Self-pay

## 2023-02-16 DIAGNOSIS — N760 Acute vaginitis: Secondary | ICD-10-CM

## 2023-02-16 DIAGNOSIS — N390 Urinary tract infection, site not specified: Secondary | ICD-10-CM

## 2023-02-16 LAB — URINALYSIS, COMPLETE W/RFL CULTURE
Bilirubin Urine: NEGATIVE
Glucose, UA: NEGATIVE
Hgb urine dipstick: NEGATIVE
Hyaline Cast: NONE SEEN /LPF
Ketones, ur: NEGATIVE
Nitrites, Initial: NEGATIVE
Protein, ur: NEGATIVE
RBC / HPF: NONE SEEN /HPF (ref 0–2)
Specific Gravity, Urine: 1.004 (ref 1.001–1.035)
pH: 6.5 (ref 5.0–8.0)

## 2023-02-16 LAB — URINE CULTURE
MICRO NUMBER:: 15090563
SPECIMEN QUALITY:: ADEQUATE

## 2023-02-16 LAB — CULTURE INDICATED

## 2023-02-16 MED ORDER — NITROFURANTOIN MONOHYD MACRO 100 MG PO CAPS: 100.0000 mg | ORAL_CAPSULE | Freq: Two times a day (BID) | ORAL | 0 refills | Status: AC

## 2023-02-16 MED ORDER — FLUCONAZOLE 150 MG PO TABS: 150.0000 mg | ORAL_TABLET | Freq: Once | ORAL | 0 refills | Status: AC

## 2023-02-22 LAB — TIQ-MISC

## 2023-02-22 LAB — MYCOPLASMA / UREAPLASMA CULTURE

## 2023-03-27 ENCOUNTER — Ambulatory Visit (HOSPITAL_COMMUNITY)
Admission: RE | Admit: 2023-03-27 | Discharge: 2023-03-27 | Disposition: A | Discharge status: Home / Self Care | Payer: BC Managed Care – PPO | Source: Ambulatory Visit | Attending: Internal Medicine | Admitting: Internal Medicine

## 2023-03-27 ENCOUNTER — Other Ambulatory Visit: Payer: Self-pay | Admitting: *Deleted

## 2023-03-27 ENCOUNTER — Other Ambulatory Visit: Payer: Self-pay

## 2023-03-27 ENCOUNTER — Inpatient Hospital Stay: Payer: BC Managed Care – PPO | Attending: Internal Medicine

## 2023-03-27 DIAGNOSIS — C7A090 Malignant carcinoid tumor of the bronchus and lung: Secondary | ICD-10-CM

## 2023-03-27 DIAGNOSIS — J90 Pleural effusion, not elsewhere classified: Secondary | ICD-10-CM | POA: Diagnosis not present

## 2023-03-27 LAB — CMP (CANCER CENTER ONLY)
ALT: 9 U/L (ref 0–44)
AST: 10 U/L — ABNORMAL LOW (ref 15–41)
Albumin: 4.3 g/dL (ref 3.5–5.0)
Alkaline Phosphatase: 59 U/L (ref 38–126)
Anion gap: 8 (ref 5–15)
BUN: 10 mg/dL (ref 6–20)
CO2: 25 mmol/L (ref 22–32)
Calcium: 9.2 mg/dL (ref 8.9–10.3)
Chloride: 105 mmol/L (ref 98–111)
Creatinine: 0.78 mg/dL (ref 0.44–1.00)
GFR, Estimated: >60 mL/min (ref >60)
Glucose, Bld: 85 mg/dL (ref 70–99)
Potassium: 4.3 mmol/L (ref 3.5–5.1)
Sodium: 138 mmol/L (ref 135–145)
Total Bilirubin: 0.7 mg/dL (ref 0.3–1.2)
Total Protein: 6.7 g/dL (ref 6.5–8.1)

## 2023-03-27 LAB — CBC WITH DIFFERENTIAL (CANCER CENTER ONLY)
Abs Immature Granulocytes: 0.02 10*3/uL (ref 0.00–0.07)
Basophils Absolute: 0 10*3/uL (ref 0.0–0.1)
Basophils Relative: 0 %
Eosinophils Absolute: 0.2 10*3/uL (ref 0.0–0.5)
Eosinophils Relative: 3 %
HCT: 41.1 % (ref 36.0–46.0)
Hemoglobin: 13.9 g/dL (ref 12.0–15.0)
Immature Granulocytes: 0 %
Lymphocytes Relative: 25 %
Lymphs Abs: 1.9 10*3/uL (ref 0.7–4.0)
MCH: 29.4 pg (ref 26.0–34.0)
MCHC: 33.8 g/dL (ref 30.0–36.0)
MCV: 87.1 fL (ref 80.0–100.0)
Monocytes Absolute: 0.5 10*3/uL (ref 0.1–1.0)
Monocytes Relative: 6 %
Neutro Abs: 5.1 10*3/uL (ref 1.7–7.7)
Neutrophils Relative %: 66 %
Platelet Count: 217 10*3/uL (ref 150–400)
RBC: 4.72 MIL/uL (ref 3.87–5.11)
RDW: 13 % (ref 11.5–15.5)
WBC Count: 7.8 10*3/uL (ref 4.0–10.5)
nRBC: 0 % (ref 0.0–0.2)

## 2023-03-27 MED ORDER — IOHEXOL 300 MG/ML  SOLN
75.0000 mL | Freq: Once | INTRAMUSCULAR | Status: AC | PRN
  Administered 2023-03-27: 75 mL via INTRAVENOUS

## 2023-03-30 ENCOUNTER — Inpatient Hospital Stay: Payer: BC Managed Care – PPO | Attending: Internal Medicine | Admitting: Internal Medicine

## 2023-03-30 ENCOUNTER — Other Ambulatory Visit: Payer: Self-pay

## 2023-03-30 VITALS — BP 119/65 | HR 90 | Temp 98.0°F | Resp 17 | Ht 66.5 in | Wt 190.4 lb

## 2023-03-30 DIAGNOSIS — Z85110 Personal history of malignant carcinoid tumor of bronchus and lung: Secondary | ICD-10-CM | POA: Insufficient documentation

## 2023-03-30 DIAGNOSIS — C7A090 Malignant carcinoid tumor of the bronchus and lung: Secondary | ICD-10-CM

## 2023-03-30 NOTE — Progress Notes (Signed)
[facility] Telephone:([phone]   Fax:([phone]) [phone]  OFFICE PROGRESS NOTE  Susan Soho, PA-C [address] [place] [address]  DIAGNOSIS: Stage IA  (T1b, N0, M0) low-grade neuroendocrine carcinoma, carcinoid tumor presented with endobronchial lesion in the bronchus intermedius. Diagnosed January 2020.   PRIOR THERAPY: 1) Status post right middle and lower bilobectomy with lymph node dissection on October 17, 2018 under the care of Dr. Dorris Fetch   CURRENT THERAPY: Observation.   INTERVAL HISTORY: Susan Elliott 27 y.o. female turns to the clinic today for annual follow up visit.  The patient is feeling fine today with no concerning complaints.  She denied having any current chest pain, shortness of breath, cough or hemoptysis.  She has no nausea, vomiting, diarrhea or constipation.  She has no headache or visual changes.  She has no recent weight loss or night sweats.  She had repeat CT scan of the chest performed recently and she is here for evaluation and discussion of her scan results.   MEDICAL HISTORY: Past Medical History:  Diagnosis Date   Asthma    Celiac disease 2011   Complication of anesthesia    migraine x3 days   Dyspnea     1/ 23/2020- just since Ive had pneumonia   Migraine    Pneumonia 2019 aug   Pneumonia due to Streptococcus pneumoniae (HCC) 08/09/2018    ALLERGIES:  is allergic to penicillins, gluten meal, and ultram [tramadol hcl].  MEDICATIONS:  Current Outpatient Medications  Medication Sig Dispense Refill   Biotin 5000 MCG TABS Take 5,000 mcg by mouth daily.     cetirizine (ZYRTEC) 10 MG tablet Take 10 mg by mouth daily.     Multiple Vitamin (MULTIVITAMIN WITH MINERALS) TABS tablet Take 1 tablet by mouth daily.     No current facility-administered medications for this visit.    SURGICAL HISTORY:  Past Surgical History:  Procedure Laterality Date   CHOLECYSTECTOMY     IR THORACENTESIS ASP PLEURAL  SPACE W/IMG GUIDE  10/31/2018   LOBECTOMY N/A 10/17/2018   Procedure: Right middle and lower  BILOBECTOMY;  Surgeon: Loreli Slot, MD;  Location: [facility] OR;  Service: Thoracic;  Laterality: N/A;   VIDEO ASSISTED THORACOSCOPY Right 10/17/2018   Procedure: VIDEO ASSISTED THORACOSCOPY with medial lymph node dissection and intercostal nerve block;  Surgeon: Loreli Slot, MD;  Location: [facility] OR;  Service: Thoracic;  Laterality: Right;   VIDEO BRONCHOSCOPY N/A 09/21/2018   Procedure: VIDEO BRONCHOSCOPY WITH FORCEPS BIOPSY, right middle lung lobe;  Surgeon: Luciano Cutter, MD;  Location[facility] OR;  Service: Thoracic;  Laterality: N/A;   VIDEO BRONCHOSCOPY N/A 10/17/2018   Procedure: VIDEO BRONCHOSCOPY;  Surgeon: Loreli Slot, MD;  [facility] OR;  Service: Thoracic;  Laterality: N/A;   VIDEO BRONCHOSCOPY N/A 10/22/2018   Procedure: VIDEO BRONCHOSCOPY;  Surgeon: Loreli Slot, MD;  Location: [facility] OR;  Service: Thoracic;  Laterality: N/A;    REVIEW OF SYSTEMS:  A comprehensive review of systems was negative.   PHYSICAL EXAMINATION: General appearance: alert, cooperative, and no distress Head: Normocephalic, without obvious abnormality, atraumatic Neck: no adenopathy, no JVD, supple, symmetrical, trachea midline, and thyroid not enlarged, symmetric, no tenderness/mass/nodules Lymph nodes: Cervical, supraclavicular, and axillary nodes normal. Resp: clear to auscultation bilaterally Back: symmetric, no curvature. ROM normal. No CVA tenderness. Cardio: regular rate and rhythm, S1, S2 normal, no murmur, click, rub or gallop GI: soft, non-tender; bowel sounds normal; no masses,  no organomegaly Extremities: extremities normal, atraumatic, no cyanosis or edema  ECOG PERFORMANCE STATUS: 0 - Asymptomatic  Blood pressure 119/65, pulse 90, temperature 98 F (36.7 C), temperature source Oral, resp. rate 17, height 5' 6.5" (1.689 m), weight 190 lb 6.4 oz (86.4 kg), SpO2 100%.  LABORATORY  DATA: Lab Results  Component Value Date   WBC 7.8 03/27/2023   HGB 13.9 03/27/2023   HCT 41.1 03/27/2023   MCV 87.1 03/27/2023   PLT 217 03/27/2023      Chemistry      Component Value Date/Time   NA 138 03/27/2023 0842   NA 138 04/10/2019 1115   K 4.3 03/27/2023 0842   CL 105 03/27/2023 0842   CO2 25 03/27/2023 0842   BUN 10 03/27/2023 0842   BUN 7 04/10/2019 1115   CREATININE 0.78 03/27/2023 0842      Component Value Date/Time   CALCIUM 9.2 03/27/2023 0842   ALKPHOS 59 03/27/2023 0842   AST 10 (L) 03/27/2023 0842   ALT 9 03/27/2023 0842   BILITOT 0.7 03/27/2023 0842       RADIOGRAPHIC STUDIES: CT CHEST W CONTRAST  Result Date: 03/29/2023 CLINICAL DATA:  History of neuroendocrine tumor; * Tracking Code: BO * EXAM: CT CHEST WITH CONTRAST TECHNIQUE: Multidetector CT imaging of the chest was performed during intravenous contrast administration. RADIATION DOSE REDUCTION: This exam was performed according to the departmental dose-optimization program which includes automated exposure control, adjustment of the mA and/or kV according to patient size and/or use of iterative reconstruction technique. CONTRAST:  75mL OMNIPAQUE IOHEXOL 300 MG/ML  SOLN COMPARISON:  Multiple priors, most recent chest CT dated October 22, 2021 FINDINGS: Cardiovascular: Normal heart size. Trace pericardial effusion. Normal caliber thoracic aorta. No atherosclerotic disease. Mediastinum/Nodes: Esophagus and thyroid are unremarkable. No enlarged lymph nodes seen in the chest. Lungs/Pleura: Stable postsurgical findings of prior right lower and middle lobectomies. Remaining central airways are patent. No new or enlarging pulmonary nodules. Stable trace right pleural effusion Upper Abdomen: No acute abnormality. Musculoskeletal: No aggressive appearing osseous lesions. IMPRESSION: 1. Stable postsurgical findings of prior right lower and middle lobectomies. No evidence of recurrent or metastatic disease in the chest.  2. Stable trace right pleural effusion. Electronically Signed   By: Allegra Lai M.D.   On: 03/29/2023 09:13     ASSESSMENT AND PLAN: This is a very pleasant 27 years old white female diagnosed with stage IA (T1b, N0, M0) low-grade neuroendocrine carcinoma, carcinoid tumor presented with endobronchial lesion in the bronchus intermedius diagnosed in January 2020 and status post right middle and lower bilobectomy with lymph node dissection on October 17, 2018 under the care of Dr. Dorris Fetch. The patient is currently on observation and she is feeling fine with no concerning complaints. She had repeat CT scan of the chest performed recently.  I personally and independently reviewed the scan with the patient and her mother.  Her scan showed no concerning findings for disease recurrence or metastasis. I recommended for her to continue on observation and follow-up visit on as-needed basis at this point. She was advised to call if she has any concerning symptoms. The patient voices understanding of current disease status and treatment options and is in agreement with the current care plan.  All questions were answered. The patient knows to call the clinic with any problems, questions or concerns. We can certainly see the patient much sooner if necessary.   Disclaimer: This note was dictated with voice recognition software. Similar sounding words can  inadvertently be transcribed and may not be corrected upon review.

## 2023-04-06 DIAGNOSIS — U071 COVID-19: Secondary | ICD-10-CM | POA: Diagnosis not present

## 2023-05-29 DIAGNOSIS — Z0000 Encounter for general adult medical examination without abnormal findings: Secondary | ICD-10-CM | POA: Diagnosis not present

## 2023-06-12 ENCOUNTER — Telehealth: Payer: Self-pay

## 2023-06-12 DIAGNOSIS — N390 Urinary tract infection, site not specified: Secondary | ICD-10-CM | POA: Diagnosis not present

## 2023-06-12 NOTE — Telephone Encounter (Signed)
Former ML pt LVM in triage line stating that she would like an rx for a medication for UTI since there are no openings for an appt until tomorrow and she is in pain. Stated that she did not desire to go to an UC. Reqested a cb.   LVMTCB

## 2023-06-12 NOTE — Telephone Encounter (Signed)
Left message to call [facility] Triage at [phone], option 4.   Last AEX 11/10/22 -ML

## 2023-06-29 DIAGNOSIS — K900 Celiac disease: Secondary | ICD-10-CM | POA: Diagnosis not present

## 2023-06-29 DIAGNOSIS — C7A8 Other malignant neuroendocrine tumors: Secondary | ICD-10-CM | POA: Diagnosis not present

## 2023-06-29 DIAGNOSIS — Z13220 Encounter for screening for lipoid disorders: Secondary | ICD-10-CM | POA: Diagnosis not present

## 2023-06-29 NOTE — Telephone Encounter (Signed)
Ok to close

## 2023-06-29 NOTE — Telephone Encounter (Signed)
Pt never returned call. Ok to close?

## 2023-07-10 ENCOUNTER — Ambulatory Visit: Payer: BC Managed Care – PPO | Admitting: Radiology

## 2023-07-10 VITALS — BP 108/74 | Temp 97.8°F

## 2023-07-10 DIAGNOSIS — Z113 Encounter for screening for infections with a predominantly sexual mode of transmission: Secondary | ICD-10-CM | POA: Diagnosis not present

## 2023-07-10 DIAGNOSIS — Z3009 Encounter for other general counseling and advice on contraception: Secondary | ICD-10-CM | POA: Diagnosis not present

## 2023-07-10 DIAGNOSIS — R300 Dysuria: Secondary | ICD-10-CM

## 2023-07-10 DIAGNOSIS — N760 Acute vaginitis: Secondary | ICD-10-CM

## 2023-07-10 LAB — URINALYSIS, COMPLETE W/RFL CULTURE
Bacteria, UA: NONE SEEN /[HPF]
Bilirubin Urine: NEGATIVE
Glucose, UA: NEGATIVE
Hgb urine dipstick: NEGATIVE
Hyaline Cast: NONE SEEN /[LPF]
Ketones, ur: NEGATIVE
Leukocyte Esterase: NEGATIVE
Nitrites, Initial: NEGATIVE
Protein, ur: NEGATIVE
RBC / HPF: NONE SEEN /[HPF] (ref 0–2)
Specific Gravity, Urine: 1.015 (ref 1.001–1.035)
WBC, UA: NONE SEEN /[HPF] (ref 0–5)
pH: 7 (ref 5.0–8.0)

## 2023-07-10 LAB — NO CULTURE INDICATED

## 2023-07-10 NOTE — Progress Notes (Signed)
      Subjective: Susan Elliott is a 27 y.o. female who complains of recurrent urinary tract infections. Complains of dysuria, urinary frequency and urgency (increased fluid intake), vaginal discharge. Symptoms began 3 days ago. Treated 05/2023 via telemedicine for UTI. Patient reports her ua is usually normal, typically need culture sent out to diagnose infection. Would like STI screen, has a newer partner, not using condoms consistently. Would like to discuss [facility] options as well.   Review of Systems  All other systems reviewed and are negative.   Past Medical History:  Diagnosis Date   Asthma    Celiac disease 2011   Complication of anesthesia    migraine x3 days   Dyspnea     1/ 23/2020- just since Ive had pneumonia   Migraine    Pneumonia 2019 aug   Pneumonia due to Streptococcus pneumoniae (HCC) 08/09/2018      Objective:  Today's Vitals   07/10/23 1505  BP: 108/74  Temp: 97.8 F (36.6 C)  TempSrc: Oral   There is no height or weight on file to calculate BMI.   Physical Exam Vitals and nursing note reviewed. Exam conducted with a chaperone present.  Constitutional:      Appearance: Normal appearance. She is well-developed.  Pulmonary:     Effort: Pulmonary effort is normal.  Abdominal:     General: Abdomen is flat.     Palpations: Abdomen is soft.  Genitourinary:    General: Normal vulva.     Vagina: Vaginal discharge present. No erythema, bleeding or lesions.     Cervix: Normal. No discharge, friability, lesion or erythema.     Uterus: Normal.      Adnexa: Right adnexa normal and left adnexa normal.  Neurological:     Mental Status: She is alert.  Psychiatric:        Mood and Affect: Mood normal.        Thought Content: Thought content normal.        Judgment: Judgment normal.      Urine dipstick shows negative for all components.  Micro exam: negative for WBC's or RBC's.   Raynelle Fanning, CMA present for exam  Assessment:/Plan:  1. Dysuria -  Urinalysis,Complete w/RFL Culture - Urine Culture  2. Acute vaginitis - SureSwab Advanced Vaginitis Plus,TMA  3. Screening for STDs (sexually transmitted diseases) - SureSwab Advanced Vaginitis Plus,TMA  4. Encounter for counseling regarding contraception Discussed all options, will consider and let us know. Will continue to use condoms for pregnancy prevention.   Will contact patient with results of testing completed today. Avoid intercourse until symptoms are resolved. Safe sex encouraged. Avoid the use of soaps or perfumed products in the peri area. Avoid tub baths and sitting in sweaty or wet clothing for prolonged periods of time.   Return if symptoms worsen or fail to improve.   Lakindra Wible B, NP 3:40 PM

## 2023-07-11 LAB — SURESWAB® ADVANCED VAGINITIS PLUS,TMA
C. trachomatis RNA, TMA: NOT DETECTED
CANDIDA SPECIES: NOT DETECTED
Candida glabrata: NOT DETECTED
N. gonorrhoeae RNA, TMA: NOT DETECTED
SURESWAB(R) ADV BACTERIAL VAGINOSIS(BV),TMA: NEGATIVE
TRICHOMONAS VAGINALIS (TV),TMA: NOT DETECTED

## 2023-07-11 LAB — URINE CULTURE
MICRO NUMBER:: 15712830
Result:: NO GROWTH
SPECIMEN QUALITY:: ADEQUATE

## 2023-10-17 ENCOUNTER — Ambulatory Visit: Payer: BC Managed Care – PPO | Admitting: Obstetrics and Gynecology

## 2023-10-17 ENCOUNTER — Encounter: Payer: Self-pay | Admitting: Obstetrics and Gynecology

## 2023-10-17 VITALS — BP 104/60 | HR 90 | Temp 97.8°F | Wt 190.0 lb

## 2023-10-17 DIAGNOSIS — R300 Dysuria: Secondary | ICD-10-CM | POA: Diagnosis not present

## 2023-10-17 DIAGNOSIS — N3000 Acute cystitis without hematuria: Secondary | ICD-10-CM | POA: Diagnosis not present

## 2023-10-17 MED ORDER — FLUCONAZOLE 150 MG PO TABS
150.0000 mg | ORAL_TABLET | Freq: Once | ORAL | 0 refills | Status: AC | PRN
Start: 2023-10-17 — End: ?

## 2023-10-17 MED ORDER — NITROFURANTOIN MONOHYD MACRO 100 MG PO CAPS: 100.0000 mg | ORAL_CAPSULE | Freq: Two times a day (BID) | ORAL | 0 refills | Status: AC

## 2023-10-17 NOTE — Progress Notes (Signed)
28 y.o. G61P0010 female here for UTI sx.  No LMP recorded. Burning with urination and feeling like she can't empty her bladder. Symptoms since yesterday.  Took AZO, no relief. Took boric acid supp yesterday due to hx of BV and current sx.  GYN HISTORY: No significant history  OB History  Gravida Para Term Preterm AB Living  1    1 0  SAB IAB Ectopic Multiple Live Births   1       # Outcome Date GA Lbr Len/2nd Weight Sex Type Anes PTL Lv  1 IAB             Past Medical History:  Diagnosis Date   Asthma    Celiac disease 2011   Complication of anesthesia    migraine x3 days   Dyspnea     1/ 23/2020- just since Ive had pneumonia   Migraine    Pneumonia 2019 aug   Pneumonia due to Streptococcus pneumoniae (HCC) 08/09/2018    Past Surgical History:  Procedure Laterality Date   CHOLECYSTECTOMY     IR THORACENTESIS ASP PLEURAL SPACE W/IMG GUIDE  10/31/2018   LOBECTOMY N/A 10/17/2018   Procedure: Right middle and lower  BILOBECTOMY;  Surgeon: Loreli Slot, MD;  Location: [facility] OR;  Service: Thoracic;  Laterality: N/A;   VIDEO ASSISTED THORACOSCOPY Right 10/17/2018   Procedure: VIDEO ASSISTED THORACOSCOPY with medial lymph node dissection and intercostal nerve block;  Surgeon: Loreli Slot, MD;  Location: [facility] OR;  Service: Thoracic;  Laterality: Right;   VIDEO BRONCHOSCOPY N/A 09/21/2018   Procedure: VIDEO BRONCHOSCOPY WITH FORCEPS BIOPSY, right middle lung lobe;  Surgeon: Luciano Cutter, MD;  Location: [facility] OR;  Service: Thoracic;  Laterality: N/A;   VIDEO BRONCHOSCOPY N/A 10/17/2018   Procedure: VIDEO BRONCHOSCOPY;  Surgeon: Loreli Slot, MD;  Location: [facility] OR;  Service: Thoracic;  Laterality: N/A;   VIDEO BRONCHOSCOPY N/A 10/22/2018   Procedure: VIDEO BRONCHOSCOPY;  Surgeon: Loreli Slot, MD;  Location: [facility] OR;  Service: Thoracic;  Laterality: N/A;    Current Outpatient Medications on File Prior to Visit  Medication Sig Dispense Refill   Biotin  5000 MCG TABS Take 5,000 mcg by mouth daily.     cetirizine (ZYRTEC) 10 MG tablet Take 10 mg by mouth daily.     dicyclomine (BENTYL) 20 MG tablet Take 20 mg by mouth as needed.     Multiple Vitamin (MULTIVITAMIN WITH MINERALS) TABS tablet Take 1 tablet by mouth daily.     No current facility-administered medications on file prior to visit.    Allergies  Allergen Reactions   Penicillins Rash and Other (See Comments)    Did it involve swelling of the face/tongue/throat, SOB, or low BP? No Did it involve sudden or severe rash/hives, skin peeling, or any reaction on the inside of your mouth or nose? #  #  #  YES  #  #  #  Did you need to seek medical attention at a hospital or doctor's office? #  #  #  YES  #  #  #  When did it last happen?Childhood allergy      Gluten Meal Other (See Comments)    Celiac disease   Ultram [Tramadol Hcl]     migraine      PE Today's Vitals   10/17/23 1627  BP: 104/60  Pulse: 90  Temp: 97.8 F (36.6 C)  TempSrc: Oral  SpO2: 99%  Weight: 190 lb (86.2 kg)  Body mass index is 30.21 kg/m.  Physical Exam Vitals reviewed.  Constitutional:      General: She is not in acute distress.    Appearance: Normal appearance.  HENT:     Head: Normocephalic and atraumatic.     Nose: Nose normal.  Eyes:     Extraocular Movements: Extraocular movements intact.     Conjunctiva/sclera: Conjunctivae normal.  Pulmonary:     Effort: Pulmonary effort is normal.  Musculoskeletal:        General: Normal range of motion.     Cervical back: Normal range of motion.  Neurological:     General: No focal deficit present.     Mental Status: She is alert.  Psychiatric:        Mood and Affect: Mood normal.        Behavior: Behavior normal.      Assessment and Plan:        Dysuria -     Urinalysis,Complete w/RFL Culture -     Nitrofurantoin Monohyd Macro; Take 1 capsule (100 mg total) by mouth 2 (two) times daily for 5 days.  Dispense: 10 capsule; Refill:  0  Acute cystitis without hematuria -     Nitrofurantoin Monohyd Macro; Take 1 capsule (100 mg total) by mouth 2 (two) times daily for 5 days.  Dispense: 10 capsule; Refill: 0 -     Fluconazole; Take 1 tablet (150 mg total) by mouth once as needed for up to 1 dose.  Dispense: 1 tablet; Refill: 0  Requests diflucan in case of yeast infection  Patient to call if still experience BV symptoms after completion of current meds.  Rosalyn Gess, MD

## 2023-10-18 LAB — URINALYSIS, COMPLETE W/RFL CULTURE
Bilirubin Urine: NEGATIVE
Glucose, UA: NEGATIVE
Hgb urine dipstick: NEGATIVE
Hyaline Cast: NONE SEEN /LPF
Ketones, ur: NEGATIVE
Leukocyte Esterase: NEGATIVE
Nitrites, Initial: POSITIVE — AB
Protein, ur: NEGATIVE
RBC / HPF: NONE SEEN /HPF (ref 0–2)
Specific Gravity, Urine: 1.015 (ref 1.001–1.035)
pH: 6.5 (ref 5.0–8.0)

## 2023-10-18 LAB — URINE CULTURE
MICRO NUMBER:: 16098068
Result:: NO GROWTH
SPECIMEN QUALITY:: ADEQUATE

## 2023-10-18 LAB — CULTURE INDICATED

## 2023-10-19 ENCOUNTER — Encounter: Payer: Self-pay | Admitting: Obstetrics and Gynecology

## 2023-11-10 ENCOUNTER — Ambulatory Visit: Payer: BC Managed Care – PPO | Admitting: Radiology

## 2023-11-14 ENCOUNTER — Encounter: Payer: Self-pay | Admitting: Radiology

## 2023-11-14 ENCOUNTER — Ambulatory Visit (INDEPENDENT_AMBULATORY_CARE_PROVIDER_SITE_OTHER): Payer: BC Managed Care – PPO | Admitting: Radiology

## 2023-11-14 VITALS — BP 114/76 | Ht 68.0 in | Wt 190.4 lb

## 2023-11-14 DIAGNOSIS — R300 Dysuria: Secondary | ICD-10-CM | POA: Diagnosis not present

## 2023-11-14 DIAGNOSIS — Z1331 Encounter for screening for depression: Secondary | ICD-10-CM | POA: Diagnosis not present

## 2023-11-14 DIAGNOSIS — Z01419 Encounter for gynecological examination (general) (routine) without abnormal findings: Secondary | ICD-10-CM

## 2023-11-14 DIAGNOSIS — N760 Acute vaginitis: Secondary | ICD-10-CM

## 2023-11-14 DIAGNOSIS — Z113 Encounter for screening for infections with a predominantly sexual mode of transmission: Secondary | ICD-10-CM

## 2023-11-14 LAB — URINALYSIS, COMPLETE W/RFL CULTURE
Bacteria, UA: NONE SEEN /HPF
Bilirubin Urine: NEGATIVE
Glucose, UA: NEGATIVE
Hgb urine dipstick: NEGATIVE
Hyaline Cast: NONE SEEN /LPF
Leukocyte Esterase: NEGATIVE
Nitrites, Initial: NEGATIVE
Protein, ur: NEGATIVE
RBC / HPF: NONE SEEN /HPF (ref 0–2)
Specific Gravity, Urine: 1.023 (ref 1.001–1.035)
WBC, UA: NONE SEEN /HPF (ref 0–5)
pH: 5.5 (ref 5.0–8.0)

## 2023-11-14 LAB — NO CULTURE INDICATED

## 2023-11-14 NOTE — Progress Notes (Signed)
 Susan Elliott 1995-09-21 [id number]   History:  28 y.o. G1P0 presents for annual exam. Complains of dysuria in the mornings. Symptoms intermitted since last ov. Urine culture was negative 10/17/23 was negative. Desires check for STIs BV and yeast. Patient did use boric acid last night.    Gynecologic History Patient's last menstrual period was 10/26/2023 (exact date). Period Cycle (Days): 28 Period Duration (Days): 5 Period Pattern: Regular Menstrual Flow: Heavy Menstrual Control: Thin pad, Maxi pad Dysmenorrhea: (!) Severe Dysmenorrhea Symptoms: Cramping Contraception/Family planning: condoms and rhythm method Sexually active: yes Last Pap: 2023. Results were: normal   Obstetric History OB History  Gravida Para Term Preterm AB Living  1    1 0  SAB IAB Ectopic Multiple Live Births   1       # Outcome Date GA Lbr Len/2nd Weight Sex Type Anes PTL Lv  1 IAB                11/14/2023    8:58 AM 10/10/2018    3:19 PM  Depression screen PHQ 2/9  Decreased Interest 0 0  Down, Depressed, Hopeless 0 0  PHQ - 2 Score 0 0     The following portions of the patient's history were reviewed and updated as appropriate: allergies, current medications, past family history, past medical history, past social history, past surgical history, and problem list.  Review of Systems  Constitutional: Negative.   Cardiovascular: Negative.   Gastrointestinal:  Negative for abdominal pain, blood in stool, constipation, nausea and vomiting.  Genitourinary: Negative.  Negative for dysuria, flank pain, frequency, hematuria and urgency.  Endo/Heme/Allergies:  Does not bruise/bleed easily.  Psychiatric/Behavioral: Negative.  Negative for depression, memory loss and substance abuse. The patient is not nervous/anxious and does not have insomnia.     Past medical history, past surgical history, family history and social history were all reviewed and documented in the EPIC chart.  Exam:  Vitals:    11/14/23 0858  BP: 114/76  Weight: 190 lb 6.4 oz (86.4 kg)  Height: 5\' 8"  (1.727 m)   Body mass index is 28.95 kg/m.  Physical Exam Vitals and nursing note reviewed. Exam conducted with a chaperone present.  Constitutional:      Appearance: Normal appearance. She is normal weight.  HENT:     Head: Normocephalic and atraumatic.  Neck:     Thyroid: No thyroid mass, thyromegaly or thyroid tenderness.  Cardiovascular:     Rate and Rhythm: Regular rhythm.     Heart sounds: Normal heart sounds.  Pulmonary:     Effort: Pulmonary effort is normal.     Breath sounds: Normal breath sounds.  Chest:  Breasts:    Breasts are symmetrical.     Right: Normal. No inverted nipple, mass, nipple discharge, skin change or tenderness.     Left: Normal. No inverted nipple, mass, nipple discharge, skin change or tenderness.  Abdominal:     General: Abdomen is flat. Bowel sounds are normal.     Palpations: Abdomen is soft.  Genitourinary:    General: Normal vulva.     Vagina: Normal. No vaginal discharge, bleeding or lesions.     Cervix: Normal. No discharge or lesion.     Uterus: Normal. Not enlarged and not tender.      Adnexa: Right adnexa normal and left adnexa normal.       Right: No mass, tenderness or fullness.         Left: No mass, tenderness or  fullness.    Lymphadenopathy:     Upper Body:     Right upper body: No axillary adenopathy.     Left upper body: No axillary adenopathy.  Skin:    General: Skin is warm and dry.  Neurological:     Mental Status: She is alert and oriented to person, place, and time.  Psychiatric:        Mood and Affect: Mood normal.        Thought Content: Thought content normal.        Judgment: Judgment normal.      Raynelle Fanning, CMA present for exam  Assessment/Plan:   1. Well woman exam with routine gynecological exam (Primary) Pap 2026  2. Dysuria If sx persist will send to urology - Urinalysis,Complete w/RFL Culture  3. Screening for STDs  (sexually transmitted diseases) - SureSwab Advanced Vaginitis Plus,TMA  4. Recurrent vaginitis - SureSwab Advanced Vaginitis Plus,TMA   Considering BC options, will look into phexxi more. Recommended a daily probiotic.  Tanda Rockers WHNP-BC 9:39 AM 11/14/2023

## 2023-11-14 NOTE — Patient Instructions (Signed)

## 2023-11-15 LAB — SURESWAB® ADVANCED VAGINITIS PLUS,TMA
C. trachomatis RNA, TMA: NOT DETECTED
CANDIDA SPECIES: NOT DETECTED
Candida glabrata: NOT DETECTED
N. gonorrhoeae RNA, TMA: NOT DETECTED
SURESWAB(R) ADV BACTERIAL VAGINOSIS(BV),TMA: NEGATIVE
TRICHOMONAS VAGINALIS (TV),TMA: NOT DETECTED

## 2024-11-20 ENCOUNTER — Ambulatory Visit: Admitting: Radiology
